# Patient Record
Sex: Female | Born: 1970 | Race: White | Hispanic: No | Smoking: Never smoker
Health system: Southern US, Community
[De-identification: ages and names within clinical notes are randomized; demographics above are authoritative.]

## PROBLEM LIST (undated history)

## (undated) DIAGNOSIS — D72829 Elevated white blood cell count, unspecified: Secondary | ICD-10-CM

## (undated) DIAGNOSIS — C9110 Chronic lymphocytic leukemia of B-cell type not having achieved remission: Secondary | ICD-10-CM

## (undated) DIAGNOSIS — E559 Vitamin D deficiency, unspecified: Secondary | ICD-10-CM

## (undated) DIAGNOSIS — C801 Malignant (primary) neoplasm, unspecified: Secondary | ICD-10-CM

## (undated) DIAGNOSIS — D649 Anemia, unspecified: Secondary | ICD-10-CM

## (undated) DIAGNOSIS — L930 Discoid lupus erythematosus: Secondary | ICD-10-CM

## (undated) HISTORY — DX: Elevated white blood cell count, unspecified: D72.829

## (undated) HISTORY — DX: Vitamin D deficiency, unspecified: E55.9

## (undated) HISTORY — PX: COLON RESECTION: SHX5231

## (undated) HISTORY — DX: Anemia, unspecified: D64.9

## (undated) HISTORY — DX: Chronic lymphocytic leukemia of B-cell type not having achieved remission: C91.10

## (undated) HISTORY — DX: Discoid lupus erythematosus: L93.0

## (undated) HISTORY — DX: Malignant (primary) neoplasm, unspecified: C80.1

---

## 2016-09-12 ENCOUNTER — Telehealth: Payer: Self-pay | Admitting: Rheumatology

## 2016-09-12 DIAGNOSIS — M2550 Pain in unspecified joint: Secondary | ICD-10-CM

## 2016-09-12 DIAGNOSIS — R300 Dysuria: Secondary | ICD-10-CM

## 2016-09-12 DIAGNOSIS — Z79899 Other long term (current) drug therapy: Secondary | ICD-10-CM

## 2016-09-12 NOTE — Telephone Encounter (Signed)
Patient had labs thru PCP 5/23, and will have those sent over here for review. Patient did had a CBC with DIFF. Patient needs to go to [facility] on [address] 6/6 for lab orders from here. Please send orders.

## 2016-09-13 NOTE — Telephone Encounter (Signed)
Labs released for patient. CBC from PCP was not received.

## 2016-09-14 ENCOUNTER — Other Ambulatory Visit: Payer: Self-pay | Admitting: Rheumatology

## 2016-09-15 LAB — URINALYSIS, ROUTINE W REFLEX MICROSCOPIC
Bilirubin Urine: NEGATIVE
GLUCOSE, UA: NEGATIVE
Hgb urine dipstick: NEGATIVE
Ketones, ur: NEGATIVE
LEUKOCYTES UA: NEGATIVE
Nitrite: NEGATIVE
Protein, ur: NEGATIVE
Specific Gravity, Urine: 1.008 (ref 1.001–1.035)
pH: 6.5 (ref 5.0–8.0)

## 2016-09-15 LAB — ANTI-DNA ANTIBODY, DOUBLE-STRANDED: DS DNA AB: 6 [IU]/mL — AB

## 2016-09-15 LAB — ANTI-NUCLEAR AB-TITER (ANA TITER)

## 2016-09-15 LAB — ANA: Anti Nuclear Antibody(ANA): POSITIVE — AB

## 2016-09-15 LAB — SEDIMENTATION RATE: SED RATE: 5 mm/h (ref 0–20)

## 2016-09-19 NOTE — Progress Notes (Signed)
Will need fu to discuss labs. Labs are stable

## 2016-09-21 ENCOUNTER — Encounter: Payer: Self-pay | Admitting: Rheumatology

## 2016-09-21 ENCOUNTER — Ambulatory Visit (INDEPENDENT_AMBULATORY_CARE_PROVIDER_SITE_OTHER): Payer: BLUE CROSS/BLUE SHIELD | Admitting: Rheumatology

## 2016-09-21 VITALS — BP 116/60 | HR 78 | Resp 14 | Ht 67.0 in | Wt 180.0 lb

## 2016-09-21 DIAGNOSIS — M19071 Primary osteoarthritis, right ankle and foot: Secondary | ICD-10-CM | POA: Diagnosis not present

## 2016-09-21 DIAGNOSIS — M19042 Primary osteoarthritis, left hand: Secondary | ICD-10-CM | POA: Diagnosis not present

## 2016-09-21 DIAGNOSIS — R5383 Other fatigue: Secondary | ICD-10-CM

## 2016-09-21 DIAGNOSIS — M7701 Medial epicondylitis, right elbow: Secondary | ICD-10-CM

## 2016-09-21 DIAGNOSIS — R768 Other specified abnormal immunological findings in serum: Secondary | ICD-10-CM | POA: Diagnosis not present

## 2016-09-21 DIAGNOSIS — M19072 Primary osteoarthritis, left ankle and foot: Secondary | ICD-10-CM | POA: Diagnosis not present

## 2016-09-21 DIAGNOSIS — M19041 Primary osteoarthritis, right hand: Secondary | ICD-10-CM

## 2016-09-21 NOTE — Progress Notes (Signed)
Office Visit Note  Patient: Holly Sanders             Date of Birth: 1970/08/17           MRN: [id number]             PCP: [name], MD Referring: No ref. provider found Visit Date: 09/21/2016 Occupation: @GUAROCC @    Subjective:  fatigue   History of Present Illness: Holly Sanders is a 46 y.o. female  with history of positive ANA and osteoarthritis. She returns today after one year. She states she's been asymptomatic. The only concern she has is fatigue. She gets tired very easily after activities. She's also noticed a lymph node in her left posterior cervical chain. She had recent haircut appointment. There is no history of recurrent oral ulcers, nasal ulcers, malar rash, photosensitivity, Raynauds.  Activities of Daily Living:  Patient reports morning stiffness for 2 minutes.   Patient Denies nocturnal pain.  Difficulty dressing/grooming: Denies Difficulty climbing stairs: Denies Difficulty getting out of chair: Denies Difficulty using hands for taps, buttons, cutlery, and/or writing: Denies   Review of Systems  Constitutional: Positive for fatigue. Negative for night sweats, weight gain, weight loss and weakness.  HENT: Negative for mouth sores, trouble swallowing, trouble swallowing, mouth dryness and nose dryness.   Eyes: Negative for pain, redness, visual disturbance and dryness.  Respiratory: Negative for cough, shortness of breath and difficulty breathing.   Cardiovascular: Negative for chest pain, palpitations, hypertension, irregular heartbeat and swelling in legs/feet.  Gastrointestinal: Negative for blood in stool, constipation and diarrhea.  Endocrine: Negative for increased urination.  Genitourinary: Negative for vaginal dryness.  Musculoskeletal: Negative for arthralgias, joint pain, joint swelling, myalgias, muscle weakness, morning stiffness, muscle tenderness and myalgias.  Skin: Negative for color change, rash, hair loss, skin tightness, ulcers and  sensitivity to sunlight.  Allergic/Immunologic: Negative for susceptible to infections.  Neurological: Negative for dizziness, memory loss and night sweats.  Hematological: Negative for swollen glands.  Psychiatric/Behavioral: Negative for depressed mood and sleep disturbance. The patient is not nervous/anxious.     PMFS History:  Patient Active Problem List   Diagnosis Date Noted  . Primary osteoarthritis of both hands 09/21/2016  . Primary osteoarthritis of both feet 09/21/2016  . ANA positive 09/21/2016  . Medial epicondylitis of elbow, right 09/21/2016    History reviewed. No pertinent past medical history.  No family history on file. Past Surgical History:  Procedure Laterality Date  . CESAREAN SECTION     Social History   Social History Narrative  . No narrative on file     Objective: Vital Signs: BP 116/60   Pulse 78   Resp 14   Ht 5\' 7"  (1.702 m)   Wt 180 lb (81.6 kg)   LMP 09/12/2016   BMI 28.19 kg/m    Physical Exam  Constitutional: She is oriented to person, place, and time. She appears well-developed and well-nourished.  HENT:  Head: Normocephalic and atraumatic.  Eyes: Conjunctivae and EOM are normal.  Neck: Normal range of motion.  Cardiovascular: Normal rate, regular rhythm, normal heart sounds and intact distal pulses.   Pulmonary/Chest: Effort normal and breath sounds normal.  Abdominal: Soft. Bowel sounds are normal.  Lymphadenopathy:    She has no cervical adenopathy.  Neurological: She is alert and oriented to person, place, and time.  Skin: Skin is warm and dry. Capillary refill takes less than 2 seconds.  Psychiatric: She has a normal mood and affect. Her behavior  is normal.  Nursing note and vitals reviewed.    Musculoskeletal Exam: C-spine and thoracic lumbar spine good range of motion. Shoulder joints elbow joints wrist joints with range of motion. She has mild DIP prominence in her hands and feet consistent with osteoarthritis. She has  mild tenderness on palpation over right medial epicondyle area consistent with medial epicondylitis. No synovitis was noted on examination today.  CDAI Exam: No CDAI exam completed.    Investigation: Findings:  April 2015:  CBC, comprehensive metabolic panel, hep panel, CK, UA, lupus anticoagulant, beta 2, anticardiolipin, C3, C4, ENA, vitamin D, B-12 were normal.  ANA was 1:1280 nuclear speckle pattern, double stranded DNA was 9.   Labs from March 02, 2014 shows C3, C4 is normal, ANA is negative, double stranded DNA is positive at 6.  It was 9 on her last one done in April 2015.  June 2017:  Sed rate, UA, CBC, comprehensive metabolic panel, TSH, HIV were all normal.  ANA is 1:40 nuclear speckled pattern and double-strand DNA was 6.  09/14/16 ANA positive 1:160 titer dsDNA 6 08/29/2016 CBC to be busy count 12.0 hemoglobin 11.9 platelets 229, CMP normal, TSH normal, HIV-negative, lipid panel LDL 121    Imaging: No results found.  Speciality Comments: No specialty comments available.    Procedures:  No procedures performed Allergies: Patient has no allergy information on record.   Assessment / Plan:     Visit Diagnoses: Primary osteoarthritis of both hands: Joint protection and muscle strengthening was discussed.  Primary osteoarthritis of both feet: Proper fitting shoes were discussed.  ANA positive - her most recent labs show low titer positive ANA and double-stranded DNA. She has no clinical features of autoimmune disease on examination today. She reports a palpable cervical lymph node which I could not palpate on examination. She has occasional oral ulcers. No synovitis was noted. She will notify me if she develops any new symptoms.  Other fatigue -her CBC recently showed anemia. Dietary sources for an were discussed. Her TSH was normal. I'll check vitamin D today. Plan: VITAMIN D 25 Hydroxy (Vit-D Deficiency, Fractures)  Medial epicondylitis of elbow, right : She does have  Voltaren gel at home which she can use topically.   Orders: Orders Placed This Encounter  Procedures  . VITAMIN D 25 Hydroxy (Vit-D Deficiency, Fractures)   No orders of the defined types were placed in this encounter.   Face-to-face time spent with patient was 20 minutes. 50% of time was spent in counseling and coordination of care.  Follow-Up Instructions: Return in about 1 year (around 09/21/2017) for +ANA, fatigue, OA, plan AVISE.   Bo Merino, MD  Note - This record has been created using Editor, commissioning.  Chart creation errors have been sought, but may not always  have been located. Such creation errors do not reflect on  the standard of medical care.

## 2016-09-22 LAB — VITAMIN D 25 HYDROXY (VIT D DEFICIENCY, FRACTURES): VIT D 25 HYDROXY: 31 ng/mL (ref 30–100)

## 2016-09-22 NOTE — Progress Notes (Signed)
Vit D 2000 U po qd

## 2016-10-03 ENCOUNTER — Telehealth: Payer: Self-pay | Admitting: Radiology

## 2016-10-03 NOTE — Telephone Encounter (Signed)
Called patient to advise  °

## 2016-10-03 NOTE — Telephone Encounter (Signed)
-----   Message from Bo Merino, MD sent at 09/22/2016  2:28 PM EDT ----- Vit D 2000 U po qd

## 2016-12-05 ENCOUNTER — Telehealth: Payer: Self-pay | Admitting: *Deleted

## 2016-12-05 NOTE — Telephone Encounter (Signed)
Results sent from [facility] drawn on on 08/29/16 WBC 12.0 Rest of labs WNL HIV-Negative

## 2017-09-07 ENCOUNTER — Telehealth: Payer: Self-pay | Admitting: Rheumatology

## 2017-09-07 NOTE — Telephone Encounter (Signed)
Patient called wanting to know if she is suppose to get labs done, and if so, where?  BD#[phone]

## 2017-09-10 NOTE — Telephone Encounter (Signed)
Patient advised that Dr. Estanislado Pandy wants her to have [place] labs and they can be done at any lab test now. Patient will come by office to pick up forms to have take to the lab to have them drawn and will reschedule appointment in 2-3 weeks.

## 2017-09-21 ENCOUNTER — Ambulatory Visit: Payer: BLUE CROSS/BLUE SHIELD | Admitting: Rheumatology

## 2018-06-13 ENCOUNTER — Other Ambulatory Visit (INDEPENDENT_AMBULATORY_CARE_PROVIDER_SITE_OTHER): Payer: Self-pay | Admitting: Orthopaedic Surgery

## 2018-06-13 MED ORDER — DOXYCYCLINE HYCLATE 100 MG PO TABS: 100.0000 mg | ORAL_TABLET | Freq: Two times a day (BID) | ORAL | 0 refills | Status: DC

## 2021-04-26 ENCOUNTER — Other Ambulatory Visit: Payer: Self-pay | Admitting: Family Medicine

## 2021-04-28 ENCOUNTER — Telehealth: Payer: Self-pay | Admitting: Internal Medicine

## 2021-04-28 NOTE — Telephone Encounter (Signed)
Scheduled appt per 1/18 referral. Spoke to pt who is aware of appt date and time. Pt is aware to arrive 15 mins prior to appt time.

## 2021-05-03 ENCOUNTER — Other Ambulatory Visit: Payer: Self-pay | Admitting: Family Medicine

## 2021-05-03 DIAGNOSIS — N6320 Unspecified lump in the left breast, unspecified quadrant: Secondary | ICD-10-CM

## 2021-05-06 ENCOUNTER — Ambulatory Visit
Admission: RE | Admit: 2021-05-06 | Discharge: 2021-05-06 | Disposition: A | Discharge status: Home / Self Care | Payer: BLUE CROSS/BLUE SHIELD | Source: Ambulatory Visit | Attending: Family Medicine | Admitting: Family Medicine

## 2021-05-06 ENCOUNTER — Ambulatory Visit
Admission: RE | Admit: 2021-05-06 | Discharge: 2021-05-06 | Disposition: A | Discharge status: Home / Self Care | Payer: PRIVATE HEALTH INSURANCE | Source: Ambulatory Visit | Attending: Family Medicine | Admitting: Family Medicine

## 2021-05-06 DIAGNOSIS — N6320 Unspecified lump in the left breast, unspecified quadrant: Secondary | ICD-10-CM

## 2021-05-07 ENCOUNTER — Other Ambulatory Visit: Payer: Self-pay | Admitting: Family Medicine

## 2021-05-07 DIAGNOSIS — N6320 Unspecified lump in the left breast, unspecified quadrant: Secondary | ICD-10-CM

## 2021-05-16 ENCOUNTER — Inpatient Hospital Stay: Payer: No Typology Code available for payment source | Attending: Internal Medicine | Admitting: Internal Medicine

## 2021-05-16 ENCOUNTER — Other Ambulatory Visit: Payer: Self-pay | Admitting: Internal Medicine

## 2021-05-16 ENCOUNTER — Inpatient Hospital Stay: Payer: No Typology Code available for payment source

## 2021-05-16 ENCOUNTER — Encounter: Payer: Self-pay | Admitting: Internal Medicine

## 2021-05-16 ENCOUNTER — Telehealth: Payer: Self-pay | Admitting: Pharmacy Technician

## 2021-05-16 ENCOUNTER — Other Ambulatory Visit: Payer: Self-pay

## 2021-05-16 VITALS — BP 135/85 | HR 96 | Temp 98.3°F | Resp 19 | Ht 67.0 in | Wt 200.9 lb

## 2021-05-16 DIAGNOSIS — D539 Nutritional anemia, unspecified: Secondary | ICD-10-CM

## 2021-05-16 DIAGNOSIS — D500 Iron deficiency anemia secondary to blood loss (chronic): Secondary | ICD-10-CM

## 2021-05-16 DIAGNOSIS — D72820 Lymphocytosis (symptomatic): Secondary | ICD-10-CM | POA: Insufficient documentation

## 2021-05-16 DIAGNOSIS — Z79899 Other long term (current) drug therapy: Secondary | ICD-10-CM | POA: Insufficient documentation

## 2021-05-16 DIAGNOSIS — Z808 Family history of malignant neoplasm of other organs or systems: Secondary | ICD-10-CM | POA: Diagnosis not present

## 2021-05-16 DIAGNOSIS — Z807 Family history of other malignant neoplasms of lymphoid, hematopoietic and related tissues: Secondary | ICD-10-CM | POA: Diagnosis not present

## 2021-05-16 DIAGNOSIS — D509 Iron deficiency anemia, unspecified: Secondary | ICD-10-CM | POA: Diagnosis not present

## 2021-05-16 LAB — FOLATE: Folate: 19.8 ng/mL (ref >5.9)

## 2021-05-16 LAB — RETICULOCYTES
Immature Retic Fract: 14.2 % (ref 2.3–15.9)
RBC.: 3.85 MIL/uL — ABNORMAL LOW (ref 3.87–5.11)
Retic Count, Absolute: 73.9 10*3/uL (ref 19.0–186.0)
Retic Ct Pct: 1.9 % (ref 0.4–3.1)

## 2021-05-16 LAB — CBC WITH DIFFERENTIAL (CANCER CENTER ONLY)
Abs Immature Granulocytes: 0.02 10*3/uL (ref 0.00–0.07)
Basophils Absolute: 0 10*3/uL (ref 0.0–0.1)
Basophils Relative: 0 %
Eosinophils Absolute: 0.2 10*3/uL (ref 0.0–0.5)
Eosinophils Relative: 1 %
HCT: 33.4 % — ABNORMAL LOW (ref 36.0–46.0)
Hemoglobin: 10.7 g/dL — ABNORMAL LOW (ref 12.0–15.0)
Immature Granulocytes: 0 %
Lymphocytes Relative: 62 %
Lymphs Abs: 9.1 10*3/uL — ABNORMAL HIGH (ref 0.7–4.0)
MCH: 28 pg (ref 26.0–34.0)
MCHC: 32 g/dL (ref 30.0–36.0)
MCV: 87.4 fL (ref 80.0–100.0)
Monocytes Absolute: 0.6 10*3/uL (ref 0.1–1.0)
Monocytes Relative: 4 %
Neutro Abs: 4.9 10*3/uL (ref 1.7–7.7)
Neutrophils Relative %: 33 %
Platelet Count: 330 10*3/uL (ref 150–400)
RBC: 3.82 MIL/uL — ABNORMAL LOW (ref 3.87–5.11)
RDW: 15.7 % — ABNORMAL HIGH (ref 11.5–15.5)
Smear Review: NORMAL
WBC Count: 14.8 10*3/uL — ABNORMAL HIGH (ref 4.0–10.5)
nRBC: 0 % (ref 0.0–0.2)

## 2021-05-16 LAB — CMP (CANCER CENTER ONLY)
ALT: 13 U/L (ref 0–44)
AST: 15 U/L (ref 15–41)
Albumin: 4.3 g/dL (ref 3.5–5.0)
Alkaline Phosphatase: 71 U/L (ref 38–126)
Anion gap: 6 (ref 5–15)
BUN: 14 mg/dL (ref 6–20)
CO2: 29 mmol/L (ref 22–32)
Calcium: 9.4 mg/dL (ref 8.9–10.3)
Chloride: 104 mmol/L (ref 98–111)
Creatinine: 0.85 mg/dL (ref 0.44–1.00)
GFR, Estimated: >60 mL/min (ref >60)
Glucose, Bld: 96 mg/dL (ref 70–99)
Potassium: 4.1 mmol/L (ref 3.5–5.1)
Sodium: 139 mmol/L (ref 135–145)
Total Bilirubin: 0.3 mg/dL (ref 0.3–1.2)
Total Protein: 7.6 g/dL (ref 6.5–8.1)

## 2021-05-16 LAB — IRON AND IRON BINDING CAPACITY (CC-WL,HP ONLY)
Iron: 35 ug/dL (ref 28–170)
Saturation Ratios: 8 % — ABNORMAL LOW (ref 10.4–31.8)
TIBC: 447 ug/dL (ref 250–450)
UIBC: 412 ug/dL (ref 148–442)

## 2021-05-16 LAB — LACTATE DEHYDROGENASE: LDH: 150 U/L (ref 98–192)

## 2021-05-16 LAB — VITAMIN B12: Vitamin B-12: 305 pg/mL (ref 180–914)

## 2021-05-16 LAB — TSH: TSH: 3.537 u[IU]/mL (ref 0.308–3.960)

## 2021-05-16 LAB — FERRITIN: Ferritin: 17 ng/mL (ref 11–307)

## 2021-05-16 NOTE — Patient Instructions (Signed)
Thank you for choosing [facility] to provide your care.   Should you have questions after your visit to the [facility] ([facility]), please contact this office at [phone] between 8:30 AM and 4:30 PM.  Voice mails left after 4:00 PM may not be returned until the following business day.  Calls received after 4:30 PM will be answered by an off-site Nurse Triage Line.    Prescription Refills:  Please have your pharmacy contact us directly for most prescription requests.  Contact the office directly for refills of narcotics (pain medications). Allow 48-72 hours for refills.  Appointments: Please contact the [facility] scheduling department [phone] for questions regarding [facility] appointment scheduling.  Contact the schedulers with any scheduling changes so that your appointment can be rescheduled in a timely manner.   Central Scheduling for [facility] [phone] - Call to schedule procedures such as PET scans, CT scans, MRI, Ultrasound, etc.  To afford each patient quality time with our providers, please arrive 30 minutes before your scheduled appointment time.  If you arrive late for your appointment, you may be asked to reschedule.  We strive to give you quality time with our providers, and arriving late affects you and other patients whose appointments are after yours. If you are a no show for multiple scheduled visits, you may be dismissed from the clinic at the providers discretion.     Resources: [facility] Social Workers [phone] for additional information on assistance programs or assistance connecting with community support programs   [place] DSS  336-641-3447: Information regarding food stamps, Medicaid, and utility assistance [place] Access [place] 336-333-6589   [place] [employer]'s shared-ride transportation service for eligible riders who have a disability that prevents them from riding the fixed route bus.   Medicare Rights Center 800-333-4114  Helps people with Medicare understand their rights and benefits, navigate the Medicare system, and secure the quality healthcare they deserve [employer] 800-227-2345 Assists patients locate various types of support and financial assistance Cancer Care: 1-800-813-HOPE (4673) Provides financial assistance, online support groups, medication/co-pay assistance.   Transportation Assistance for appointments at [facility]: Transportation Coordinator [phone]  Again, thank you for choosing [facility] for your care.       

## 2021-05-16 NOTE — Progress Notes (Signed)
[facility] Telephone:([phone]   [phone]  CONSULT NOTE  REFERRING PHYSICIAN: Dr. Rachell Cipro  REASON FOR CONSULTATION:  51 years old white female with leukocytosis and anemia  HPI Holly Sanders is a 51 y.o. female with past medical history significant for anemia for several years in addition to history of lupus, vitamin D deficiency, dyslipidemia.  The patient was followed by her primary care physician and routine blood work showed persistent elevated white blood count in addition to anemia.  She had iron studies performed at that time that showed low serum ferritin of 9.  The patient started ferrous sulfate around 6 months ago with no improvement in her anemia.  She is to have anemia for long time with heavy menstrual period and clotting.  She did not take any iron supplements during that time.  She is feeling fine with no concerning complaints except for mild fatigue.  She has no dizzy spells.  She has no bleeding, bruises or ecchymosis.  She has no chest pain, shortness of breath, cough or hemoptysis.  She has no nausea, vomiting, diarrhea or constipation. Family history significant for father with multiple malignancy including lymphoma, sarcoma and squamous cell carcinoma of the head and neck.  Mother had heart disease.  Brother had heart disease. She is divorced and has 3 children (triplet age 74).  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  The patient has no history of smoking, alcohol or drug abuse.  HPI  Past Medical History:  Diagnosis Date   Anemia    Leukocytosis    Lupus erythematosus    Vitamin D deficiency     Past Surgical History:  Procedure Laterality Date   CESAREAN SECTION      Family History  Problem Relation Age of Onset   Heart disease Mother    Lymphoma Father    Squamous cell carcinoma Father    Basal cell carcinoma Father    Heart disease Brother     Social History Social  History   Tobacco Use   Smoking status: Never   Smokeless tobacco: Never  Vaping Use   Vaping Use: Never used  Substance Use Topics   Alcohol use: No   Drug use: No    Not on File  Current Outpatient Medications  Medication Sig Dispense Refill   diclofenac sodium (VOLTAREN) 1 % GEL Apply 4 g topically 4 (four) times daily. Prn     doxycycline (VIBRA-TABS) 100 MG tablet Take 1 tablet (100 mg total) by mouth 2 (two) times daily. 30 tablet 0   No current facility-administered medications for this visit.    Review of Systems  Constitutional: positive for fatigue Eyes: negative Ears, nose, mouth, throat, and face: negative Respiratory: negative Cardiovascular: negative Gastrointestinal: negative Genitourinary:negative Integument/breast: negative Hematologic/lymphatic: negative Musculoskeletal:negative Neurological: negative Behavioral/Psych: negative Endocrine: negative Allergic/Immunologic: negative  Physical Exam  CWU:GQBVQ, healthy, no distress, well nourished, and well developed SKIN: skin color, texture, turgor are normal, no rashes or significant lesions HEAD: Normocephalic, No masses, lesions, tenderness or abnormalities EYES: normal, PERRLA, Conjunctiva are pink and non-injected EARS: External ears normal, Canals clear OROPHARYNX:no exudate, no erythema, and lips, buccal mucosa, and tongue normal  NECK: supple, no adenopathy, no JVD LYMPH:  no palpable lymphadenopathy, no hepatosplenomegaly BREAST:not examined LUNGS: clear to auscultation , and palpation HEART: regular rate & rhythm, no murmurs, and no gallops ABDOMEN:abdomen soft, non-tender, normal bowel sounds, and no masses or organomegaly BACK: Back  symmetric, no curvature., No CVA tenderness EXTREMITIES:no joint deformities, effusion, or inflammation, no edema  NEURO: alert & oriented x 3 with fluent speech, no focal motor/sensory deficits  PERFORMANCE STATUS: ECOG 1  LABORATORY DATA: Lab Results   Component Value Date   WBC 14.8 (H) 05/16/2021   HGB 10.7 (L) 05/16/2021   HCT 33.4 (L) 05/16/2021   MCV 87.4 05/16/2021   PLT 330 05/16/2021      Chemistry      Component Value Date/Time   NA 139 05/16/2021 1123   K 4.1 05/16/2021 1123   CL 104 05/16/2021 1123   CO2 29 05/16/2021 1123   BUN 14 05/16/2021 1123   CREATININE 0.85 05/16/2021 1123      Component Value Date/Time   CALCIUM 9.4 05/16/2021 1123   ALKPHOS 71 05/16/2021 1123   AST 15 05/16/2021 1123   ALT 13 05/16/2021 1123   BILITOT 0.3 05/16/2021 1123       RADIOGRAPHIC STUDIES: US BREAST LTD UNI LEFT INC AXILLA  Result Date: 05/06/2021 CLINICAL DATA:  Palpable abnormality in the LEFT breast noted about a month ago. Mass has resolved. EXAM: DIGITAL DIAGNOSTIC BILATERAL MAMMOGRAM WITH TOMOSYNTHESIS AND CAD; ULTRASOUND LEFT BREAST LIMITED TECHNIQUE: Bilateral digital diagnostic mammography and breast tomosynthesis was performed. The images were evaluated with computer-aided detection.; Targeted ultrasound examination of the left breast was performed. COMPARISON:  None. ACR Breast Density Category b: There are scattered areas of fibroglandular density. FINDINGS: No suspicious mass, distortion, or microcalcifications are identified to suggest presence of malignancy. On physical exam, I palpate no discrete mass in the 12 o'clock location of the LEFT breast 8 centimeters from the nipple, in the area where patient originally palpated a mass. Patient is no longer able to feel the mass. There are no visible changes of the skin in this region. Targeted ultrasound is performed, showing normal appearing fibrofatty tissue in the area of patient's concern. Incidental note is made a mildly dilated duct in the 12 o'clock location of the LEFT breast 1 centimeter from the nipple, not associated with intraductal mass or suspicious features. IMPRESSION: No mammographic or ultrasound evidence for malignancy. RECOMMENDATION: Screening mammogram  in one year.(Code:SM-B-01Y) I have discussed the findings and recommendations with the patient. If applicable, a reminder letter will be sent to the patient regarding the next appointment. BI-RADS CATEGORY  2: Benign. Electronically Signed   By: Nolon Nations M.D.   On: 05/06/2021 12:16  MM DIAG BREAST TOMO BILATERAL  Result Date: 05/06/2021 CLINICAL DATA:  Palpable abnormality in the LEFT breast noted about a month ago. Mass has resolved. EXAM: DIGITAL DIAGNOSTIC BILATERAL MAMMOGRAM WITH TOMOSYNTHESIS AND CAD; ULTRASOUND LEFT BREAST LIMITED TECHNIQUE: Bilateral digital diagnostic mammography and breast tomosynthesis was performed. The images were evaluated with computer-aided detection.; Targeted ultrasound examination of the left breast was performed. COMPARISON:  None. ACR Breast Density Category b: There are scattered areas of fibroglandular density. FINDINGS: No suspicious mass, distortion, or microcalcifications are identified to suggest presence of malignancy. On physical exam, I palpate no discrete mass in the 12 o'clock location of the LEFT breast 8 centimeters from the nipple, in the area where patient originally palpated a mass. Patient is no longer able to feel the mass. There are no visible changes of the skin in this region. Targeted ultrasound is performed, showing normal appearing fibrofatty tissue in the area of patient's concern. Incidental note is made a mildly dilated duct in the 12 o'clock location of the LEFT breast 1 centimeter from the  nipple, not associated with intraductal mass or suspicious features. IMPRESSION: No mammographic or ultrasound evidence for malignancy. RECOMMENDATION: Screening mammogram in one year.(Code:SM-B-01Y) I have discussed the findings and recommendations with the patient. If applicable, a reminder letter will be sent to the patient regarding the next appointment. BI-RADS CATEGORY  2: Benign. Electronically Signed   By: Nolon Nations M.D.   On: 05/06/2021  12:16    ASSESSMENT: This is a very pleasant 51 years old white female presented for evaluation of leukocytosis and anemia. Her leukocytosis is mainly in the form of lymphocytosis and there is concern about possibility of chronic lymphocytic leukemia. Her anemia secondary to iron deficiency.   PLAN: I had a lengthy discussion with the patient today about her current condition and further investigation to identify the underlying etiology of her condition. I ordered several studies today for evaluation of her anemia and lymphocytosis. I ordered CBC, comprehensive metabolic panel, LDH, iron study, ferritin, serum folate, vitamin B12, serum protein electrophoresis with immunofixation, flow cytometry of the peripheral blood for the CLL panel. I will arrange for the patient to receive iron infusion with Venofer 300 mg IV weekly for 3 weeks at the [facility] center because of her persistent anemia and iron deficiency with no improvement in the oral iron tablets. I will arrange for the patient to come back for follow-up visit in 3 weeks for evaluation and discussion of her blood work and further recommendation regarding her lymphocytosis. The patient was advised to call immediately if she has any other concerning symptoms in the interval.  The patient voices understanding of current disease status and treatment options and is in agreement with the current care plan.  All questions were answered. The patient knows to call the clinic with any problems, questions or concerns. We can certainly see the patient much sooner if necessary.  Thank you so much for allowing me to participate in the care of Tiger. I will continue to follow up the patient with you and assist in her care.  The total time spent in the appointment was 60 minutes.  Disclaimer: This note was dictated with voice recognition software. Similar sounding words can inadvertently be transcribed and may not be corrected upon  review.   Eilleen Kempf May 16, 2021, 11:48 AM

## 2021-05-16 NOTE — Telephone Encounter (Signed)
VENOFERFranz Sanders  ID# [id number] GR# [id number] PHONE: [phone]  Spoke with patient in regards to insurance being in-active. patient is considering paying cash for treatment [employer] issue is resolved. Awaiting f/u call/determination from patient.

## 2021-05-17 LAB — PROTEIN ELECTROPHORESIS, SERUM, WITH REFLEX
A/G Ratio: 1.1 (ref 0.7–1.7)
Albumin ELP: 3.6 g/dL (ref 2.9–4.4)
Alpha-1-Globulin: 0.2 g/dL (ref 0.0–0.4)
Alpha-2-Globulin: 0.8 g/dL (ref 0.4–1.0)
Beta Globulin: 1.2 g/dL (ref 0.7–1.3)
Gamma Globulin: 1 g/dL (ref 0.4–1.8)
Globulin, Total: 3.2 g/dL (ref 2.2–3.9)
Total Protein ELP: 6.8 g/dL (ref 6.0–8.5)

## 2021-05-17 LAB — SURGICAL PATHOLOGY

## 2021-05-19 LAB — FLOW CYTOMETRY

## 2021-05-23 ENCOUNTER — Encounter: Payer: Self-pay | Admitting: Internal Medicine

## 2021-05-23 NOTE — Telephone Encounter (Signed)
Auth Submission: NO AUTH NEEDED Payer: [employer] Medication & CPT/J Code(s) submitted: Venofer (Iron Sucrose) J1756 Route of submission (phone, fax, portal): PHONE Auth type: Buy/Bill Units/visits requested: 3 Reference number: [id number]-E 05/23/21 1:26p Approval from: 05/23/21 to 09/20/21  Patient will be scheduled as soon as possible

## 2021-05-23 NOTE — Telephone Encounter (Signed)
Patient call with updated insurance info: [facility] ID# [id number] GR# [place] PCN# [id number] [phone]

## 2021-05-24 ENCOUNTER — Telehealth: Payer: Self-pay | Admitting: Internal Medicine

## 2021-05-24 NOTE — Telephone Encounter (Signed)
Sch per 2/14 inbasket, pt aware °

## 2021-05-26 ENCOUNTER — Ambulatory Visit (INDEPENDENT_AMBULATORY_CARE_PROVIDER_SITE_OTHER): Payer: No Typology Code available for payment source

## 2021-05-26 ENCOUNTER — Encounter: Payer: Self-pay | Admitting: Internal Medicine

## 2021-05-26 ENCOUNTER — Other Ambulatory Visit: Payer: Self-pay

## 2021-05-26 VITALS — BP 123/70 | HR 76 | Temp 98.4°F | Resp 16 | Wt 200.4 lb

## 2021-05-26 DIAGNOSIS — D500 Iron deficiency anemia secondary to blood loss (chronic): Secondary | ICD-10-CM

## 2021-05-26 MED ORDER — FAMOTIDINE IN NACL 20-0.9 MG/50ML-% IV SOLN: 20.0000 mg | Freq: Once | INTRAVENOUS | Status: DC | PRN

## 2021-05-26 MED ORDER — METHYLPREDNISOLONE SODIUM SUCC 125 MG IJ SOLR: 125.0000 mg | Freq: Once | INTRAMUSCULAR | Status: DC | PRN

## 2021-05-26 MED ORDER — ALBUTEROL SULFATE HFA 108 (90 BASE) MCG/ACT IN AERS: 2.0000 | INHALATION_SPRAY | Freq: Once | RESPIRATORY_TRACT | Status: DC | PRN

## 2021-05-26 MED ORDER — SODIUM CHLORIDE 0.9 % IV SOLN
300.0000 mg | INTRAVENOUS | Status: DC
  Administered 2021-05-26: 300 mg via INTRAVENOUS
  Filled 2021-05-26: qty 15

## 2021-05-26 MED ORDER — DIPHENHYDRAMINE HCL 50 MG/ML IJ SOLN: 50.0000 mg | Freq: Once | INTRAMUSCULAR | Status: DC | PRN

## 2021-05-26 MED ORDER — EPINEPHRINE 0.3 MG/0.3ML IJ SOAJ: 0.3000 mg | Freq: Once | INTRAMUSCULAR | Status: DC | PRN

## 2021-05-26 MED ORDER — SODIUM CHLORIDE 0.9 % IV SOLN: Freq: Once | INTRAVENOUS | Status: DC | PRN

## 2021-05-26 NOTE — Progress Notes (Signed)
Diagnosis: Iron Deficiency Anemia  Provider:  Marshell Garfinkel, MD  Procedure: Infusion  IV Type: Peripheral, IV Location: L Forearm  Venofer (Iron Sucrose), Dose: 300 mg  Infusion Start Time: 1368  Infusion Stop Time: 5992  Post Infusion IV Care: Observation period completed  Discharge: Condition: Good, Destination: Home . AVS provided to patient.   Performed by:  Paul Dykes, RN

## 2021-06-02 ENCOUNTER — Other Ambulatory Visit: Payer: Self-pay

## 2021-06-02 ENCOUNTER — Ambulatory Visit (INDEPENDENT_AMBULATORY_CARE_PROVIDER_SITE_OTHER): Payer: No Typology Code available for payment source

## 2021-06-02 VITALS — BP 116/74 | HR 68 | Temp 98.3°F | Resp 18 | Ht 67.0 in | Wt 201.8 lb

## 2021-06-02 DIAGNOSIS — D500 Iron deficiency anemia secondary to blood loss (chronic): Secondary | ICD-10-CM | POA: Diagnosis not present

## 2021-06-02 MED ORDER — SODIUM CHLORIDE 0.9 % IV SOLN: Freq: Once | INTRAVENOUS | Status: DC | PRN

## 2021-06-02 MED ORDER — FAMOTIDINE IN NACL 20-0.9 MG/50ML-% IV SOLN: 20.0000 mg | Freq: Once | INTRAVENOUS | Status: DC | PRN

## 2021-06-02 MED ORDER — SODIUM CHLORIDE 0.9 % IV SOLN
300.0000 mg | INTRAVENOUS | Status: DC
  Administered 2021-06-02: 300 mg via INTRAVENOUS
  Filled 2021-06-02: qty 15

## 2021-06-02 MED ORDER — EPINEPHRINE 0.3 MG/0.3ML IJ SOAJ: 0.3000 mg | Freq: Once | INTRAMUSCULAR | Status: DC | PRN

## 2021-06-02 MED ORDER — DIPHENHYDRAMINE HCL 50 MG/ML IJ SOLN: 50.0000 mg | Freq: Once | INTRAMUSCULAR | Status: DC | PRN

## 2021-06-02 MED ORDER — ALBUTEROL SULFATE HFA 108 (90 BASE) MCG/ACT IN AERS: 2.0000 | INHALATION_SPRAY | Freq: Once | RESPIRATORY_TRACT | Status: DC | PRN

## 2021-06-02 MED ORDER — METHYLPREDNISOLONE SODIUM SUCC 125 MG IJ SOLR: 125.0000 mg | Freq: Once | INTRAMUSCULAR | Status: DC | PRN

## 2021-06-02 NOTE — Progress Notes (Signed)
Diagnosis: Iron Deficiency Anemia  Provider:  Marshell Garfinkel, MD  Procedure: Infusion  IV Type: Peripheral, IV Location: R Forearm  Venofer (Iron Sucrose), Dose: 300 mg  Infusion Start Time: 13.46 06/02/2021  Infusion Stop Time: 15.29 06/02/2021  Post Infusion IV Care: Peripheral IV Discontinued  Discharge: Condition: Good, Destination: Home . AVS provided to patient.   Performed by:  Arnoldo Morale, RN

## 2021-06-06 ENCOUNTER — Inpatient Hospital Stay: Payer: No Typology Code available for payment source

## 2021-06-06 ENCOUNTER — Inpatient Hospital Stay: Payer: No Typology Code available for payment source | Admitting: Internal Medicine

## 2021-06-09 ENCOUNTER — Other Ambulatory Visit: Payer: Self-pay

## 2021-06-09 ENCOUNTER — Ambulatory Visit (INDEPENDENT_AMBULATORY_CARE_PROVIDER_SITE_OTHER): Payer: No Typology Code available for payment source

## 2021-06-09 VITALS — BP 123/78 | HR 75 | Temp 98.0°F | Resp 16 | Ht 67.0 in | Wt 201.8 lb

## 2021-06-09 DIAGNOSIS — D500 Iron deficiency anemia secondary to blood loss (chronic): Secondary | ICD-10-CM

## 2021-06-09 MED ORDER — SODIUM CHLORIDE 0.9 % IV SOLN: Freq: Once | INTRAVENOUS | Status: DC | PRN

## 2021-06-09 MED ORDER — FAMOTIDINE IN NACL 20-0.9 MG/50ML-% IV SOLN: 20.0000 mg | Freq: Once | INTRAVENOUS | Status: DC | PRN

## 2021-06-09 MED ORDER — METHYLPREDNISOLONE SODIUM SUCC 125 MG IJ SOLR: 125.0000 mg | Freq: Once | INTRAMUSCULAR | Status: DC | PRN

## 2021-06-09 MED ORDER — SODIUM CHLORIDE 0.9 % IV SOLN
300.0000 mg | INTRAVENOUS | Status: DC
  Administered 2021-06-09: 300 mg via INTRAVENOUS
  Filled 2021-06-09: qty 15

## 2021-06-09 MED ORDER — DIPHENHYDRAMINE HCL 50 MG/ML IJ SOLN: 50.0000 mg | Freq: Once | INTRAMUSCULAR | Status: DC | PRN

## 2021-06-09 MED ORDER — EPINEPHRINE 0.3 MG/0.3ML IJ SOAJ: 0.3000 mg | Freq: Once | INTRAMUSCULAR | Status: DC | PRN

## 2021-06-09 MED ORDER — ALBUTEROL SULFATE HFA 108 (90 BASE) MCG/ACT IN AERS: 2.0000 | INHALATION_SPRAY | Freq: Once | RESPIRATORY_TRACT | Status: DC | PRN

## 2021-06-09 NOTE — Progress Notes (Signed)
Diagnosis: Iron Deficiency Anemia ? ?Provider:  Marshell Garfinkel, MD ? ?Procedure: Infusion ? ?IV Type: Peripheral, IV Location: L Forearm ? ?Venofer (Iron Sucrose), Dose: 300 mg ? ?Infusion Start Time: 2094 ? ?Infusion Stop Time: 7096 ? ?Post Infusion IV Care: Peripheral IV Discontinued ? ?Discharge: Condition: Good, Destination: Home . AVS provided to patient.  ? ?Performed by:  Paul Dykes, RN  ?  ?

## 2021-06-13 ENCOUNTER — Other Ambulatory Visit: Payer: Self-pay

## 2021-06-13 ENCOUNTER — Inpatient Hospital Stay (HOSPITAL_BASED_OUTPATIENT_CLINIC_OR_DEPARTMENT_OTHER): Payer: No Typology Code available for payment source | Admitting: Internal Medicine

## 2021-06-13 ENCOUNTER — Inpatient Hospital Stay: Payer: No Typology Code available for payment source | Attending: Internal Medicine

## 2021-06-13 VITALS — BP 132/78 | HR 90 | Temp 97.9°F | Resp 19 | Ht 67.0 in | Wt 203.5 lb

## 2021-06-13 DIAGNOSIS — D500 Iron deficiency anemia secondary to blood loss (chronic): Secondary | ICD-10-CM | POA: Diagnosis not present

## 2021-06-13 DIAGNOSIS — C9110 Chronic lymphocytic leukemia of B-cell type not having achieved remission: Secondary | ICD-10-CM | POA: Insufficient documentation

## 2021-06-13 DIAGNOSIS — D72820 Lymphocytosis (symptomatic): Secondary | ICD-10-CM

## 2021-06-13 DIAGNOSIS — D509 Iron deficiency anemia, unspecified: Secondary | ICD-10-CM | POA: Insufficient documentation

## 2021-06-13 LAB — CBC WITH DIFFERENTIAL (CANCER CENTER ONLY)
Abs Immature Granulocytes: 0.04 10*3/uL (ref 0.00–0.07)
Basophils Absolute: 0.1 10*3/uL (ref 0.0–0.1)
Basophils Relative: 0 %
Eosinophils Absolute: 0.1 10*3/uL (ref 0.0–0.5)
Eosinophils Relative: 1 %
HCT: 31.8 % — ABNORMAL LOW (ref 36.0–46.0)
Hemoglobin: 10.5 g/dL — ABNORMAL LOW (ref 12.0–15.0)
Immature Granulocytes: 0 %
Lymphocytes Relative: 58 %
Lymphs Abs: 8.7 10*3/uL — ABNORMAL HIGH (ref 0.7–4.0)
MCH: 29 pg (ref 26.0–34.0)
MCHC: 33 g/dL (ref 30.0–36.0)
MCV: 87.8 fL (ref 80.0–100.0)
Monocytes Absolute: 0.7 10*3/uL (ref 0.1–1.0)
Monocytes Relative: 5 %
Neutro Abs: 5.4 10*3/uL (ref 1.7–7.7)
Neutrophils Relative %: 36 %
Platelet Count: 276 10*3/uL (ref 150–400)
RBC: 3.62 MIL/uL — ABNORMAL LOW (ref 3.87–5.11)
RDW: 16.2 % — ABNORMAL HIGH (ref 11.5–15.5)
Smear Review: NORMAL
WBC Count: 14.9 10*3/uL — ABNORMAL HIGH (ref 4.0–10.5)
nRBC: 0 % (ref 0.0–0.2)

## 2021-06-13 NOTE — Progress Notes (Signed)
?    [facility] ?[phone]   [phone] ? ?OFFICE PROGRESS NOTE ? ?[name], MD ?[address] ?[place] [address] ? ?DIAGNOSIS:  ?1) Lymphocytosis consistent with CLL. ?2) iron deficient anemia ? ?PRIOR THERAPY: Iron infusion with Venofer 300 mg IV weekly for 3 weeks. ? ?CURRENT THERAPY: None ? ?INTERVAL HISTORY: ?Holly Sanders 51 y.o. female returns to the clinic today for follow-up visit.  The patient is feeling fine today with no concerning complaints.  She denied having any fatigue or weakness.  She has no nausea, vomiting, diarrhea or constipation.  She has no headache or visual changes.  She denied having any recent weight loss or night sweats.  She started iron infusion with Venofer for 3 weeks and she completed her treatment last week.  She is here today for evaluation and repeat blood work. ? ?MEDICAL HISTORY: ?Past Medical History:  ?Diagnosis Date  ? Anemia   ? Leukocytosis   ? Lupus erythematosus   ? Vitamin D deficiency   ? ? ?ALLERGIES:  has no allergies on file. ? ?MEDICATIONS:  ?Current Outpatient Medications  ?Medication Sig Dispense Refill  ? Cholecalciferol (VITAMIN D-3) 125 MCG (5000 UT) TABS Take 1 tablet by mouth daily at 2 PM.    ? diclofenac sodium (VOLTAREN) 1 % GEL Apply 4 g topically 4 (four) times daily. Prn (Patient not taking: Reported on 05/16/2021)    ? doxycycline (VIBRA-TABS) 100 MG tablet Take 1 tablet (100 mg total) by mouth 2 (two) times daily. (Patient not taking: Reported on 05/16/2021) 30 tablet 0  ? Ferrous Sulfate Dried (FEOSOL PO) Take 1 tablet by mouth daily.    ? ?No current facility-administered medications for this visit.  ? ? ?SURGICAL HISTORY:  ?Past Surgical History:  ?Procedure Laterality Date  ? CESAREAN SECTION    ? CESAREAN SECTION    ? ? ?REVIEW OF SYSTEMS:  A comprehensive review of systems was negative.  ? ?PHYSICAL EXAMINATION: General appearance: alert, cooperative, and no distress ?Head: Normocephalic,  without obvious abnormality, atraumatic ?Neck: no adenopathy, no JVD, supple, symmetrical, trachea midline, and thyroid not enlarged, symmetric, no tenderness/mass/nodules ?Lymph nodes: Cervical, supraclavicular, and axillary nodes normal. ?Resp: clear to auscultation bilaterally ?Back: symmetric, no curvature. ROM normal. No CVA tenderness. ?Cardio: regular rate and rhythm, S1, S2 normal, no murmur, click, rub or gallop ?GI: soft, non-tender; bowel sounds normal; no masses,  no organomegaly ?Extremities: extremities normal, atraumatic, no cyanosis or edema ? ?ECOG PERFORMANCE STATUS: 0 - Asymptomatic ? ?Blood pressure 132/78, pulse 90, temperature 97.9 ?F (36.6 ?C), temperature source Tympanic, resp. rate 19, height '5\' 7"'$  (1.702 m), weight 203 lb 8 oz (92.3 kg), SpO2 100 %. ? ?LABORATORY DATA: ?Lab Results  ?Component Value Date  ? WBC 14.9 (H) 06/13/2021  ? HGB 10.5 (L) 06/13/2021  ? HCT 31.8 (L) 06/13/2021  ? MCV 87.8 06/13/2021  ? PLT 276 06/13/2021  ? ? ?  Chemistry   ?   ?Component Value Date/Time  ? NA 139 05/16/2021 1123  ? K 4.1 05/16/2021 1123  ? CL 104 05/16/2021 1123  ? CO2 29 05/16/2021 1123  ? BUN 14 05/16/2021 1123  ? CREATININE 0.85 05/16/2021 1123  ?    ?Component Value Date/Time  ? CALCIUM 9.4 05/16/2021 1123  ? ALKPHOS 71 05/16/2021 1123  ? AST 15 05/16/2021 1123  ? ALT 13 05/16/2021 1123  ? BILITOT 0.3 05/16/2021 1123  ?  ? ? ? ?RADIOGRAPHIC STUDIES: ?No results  found. ? ?ASSESSMENT AND PLAN: This is a very pleasant 51 years old white female with stage 0 chronic lymphocytic leukemia in addition to iron deficiency anemia. ?The patient underwent iron infusion with Venofer 300 mg IV weekly for 3 weeks completed last week. ?She had repeat CBC today that showed persistent leukocytosis as well as mild anemia. ?I recommended for the patient to continue with the oral iron tablets for now. ?I will see her back for follow-up visit in 2 months for evaluation and repeat CBC, iron study and ferritin. ?The  patient was advised to call immediately if she has any other concerning symptoms in the interval. ?The patient voices understanding of current disease status and treatment options and is in agreement with the current care plan. ? ?All questions were answered. The patient knows to call the clinic with any problems, questions or concerns. We can certainly see the patient much sooner if necessary. ? ?The total time spent in the appointment was 20 minutes. ? ?Disclaimer: This note was dictated with voice recognition software. Similar sounding words can inadvertently be transcribed and may not be corrected upon review. ? ? ?  ?   ?

## 2021-06-20 ENCOUNTER — Other Ambulatory Visit: Payer: Self-pay | Admitting: Pharmacy Technician

## 2021-07-26 ENCOUNTER — Telehealth: Payer: Self-pay | Admitting: Internal Medicine

## 2021-07-26 NOTE — Telephone Encounter (Signed)
Called patient regarding upcoming appointments, patient is notified. 

## 2021-08-15 ENCOUNTER — Inpatient Hospital Stay: Payer: 59 | Attending: Internal Medicine

## 2021-08-15 ENCOUNTER — Other Ambulatory Visit: Payer: Self-pay

## 2021-08-15 ENCOUNTER — Inpatient Hospital Stay: Payer: 59 | Admitting: Internal Medicine

## 2021-08-15 VITALS — BP 143/73 | HR 91 | Temp 98.8°F | Resp 17 | Wt 207.2 lb

## 2021-08-15 DIAGNOSIS — D509 Iron deficiency anemia, unspecified: Secondary | ICD-10-CM | POA: Insufficient documentation

## 2021-08-15 DIAGNOSIS — C9110 Chronic lymphocytic leukemia of B-cell type not having achieved remission: Secondary | ICD-10-CM | POA: Diagnosis present

## 2021-08-15 DIAGNOSIS — D500 Iron deficiency anemia secondary to blood loss (chronic): Secondary | ICD-10-CM

## 2021-08-15 LAB — CBC WITH DIFFERENTIAL (CANCER CENTER ONLY)
Abs Immature Granulocytes: 0.04 10*3/uL (ref 0.00–0.07)
Basophils Absolute: 0.1 10*3/uL (ref 0.0–0.1)
Basophils Relative: 1 %
Eosinophils Absolute: 0.2 10*3/uL (ref 0.0–0.5)
Eosinophils Relative: 1 %
HCT: 34 % — ABNORMAL LOW (ref 36.0–46.0)
Hemoglobin: 10.9 g/dL — ABNORMAL LOW (ref 12.0–15.0)
Immature Granulocytes: 0 %
Lymphocytes Relative: 57 %
Lymphs Abs: 8.8 10*3/uL — ABNORMAL HIGH (ref 0.7–4.0)
MCH: 29.1 pg (ref 26.0–34.0)
MCHC: 32.1 g/dL (ref 30.0–36.0)
MCV: 90.7 fL (ref 80.0–100.0)
Monocytes Absolute: 1 10*3/uL (ref 0.1–1.0)
Monocytes Relative: 6 %
Neutro Abs: 5.5 10*3/uL (ref 1.7–7.7)
Neutrophils Relative %: 35 %
Platelet Count: 282 10*3/uL (ref 150–400)
RBC: 3.75 MIL/uL — ABNORMAL LOW (ref 3.87–5.11)
RDW: 14.6 % (ref 11.5–15.5)
WBC Count: 15.5 10*3/uL — ABNORMAL HIGH (ref 4.0–10.5)
nRBC: 0 % (ref 0.0–0.2)

## 2021-08-15 LAB — IRON AND IRON BINDING CAPACITY (CC-WL,HP ONLY)
Iron: 64 ug/dL (ref 28–170)
Saturation Ratios: 19 % (ref 10.4–31.8)
TIBC: 337 ug/dL (ref 250–450)
UIBC: 273 ug/dL (ref 148–442)

## 2021-08-15 LAB — FERRITIN: Ferritin: 57 ng/mL (ref 11–307)

## 2021-08-15 NOTE — Progress Notes (Signed)
?    [facility] ?Telephone:([phone]   [phone] ? ?OFFICE PROGRESS NOTE ? ?[name], MD ?[address] ?[place] [address] ? ?DIAGNOSIS:  ?1) Lymphocytosis consistent with CLL diagnosed in February 2023. ?2) iron deficient anemia ? ?PRIOR THERAPY: Iron infusion with Venofer 300 mg IV weekly for 3 weeks. ? ?CURRENT THERAPY: None ? ?INTERVAL HISTORY: ?Holly Sanders 51 y.o. female returns to the clinic today for follow-up visit.  The patient is feeling fine today with no concerning complaints except for very mild fatigue.  She denied having any current chest pain, shortness of breath, cough or hemoptysis.  She has no nausea, vomiting, diarrhea or constipation.  She has no headache or visual changes.  She tolerated her previous iron infusion fairly well.  She is here today for evaluation and repeat blood work. ? ?MEDICAL HISTORY: ?Past Medical History:  ?Diagnosis Date  ? Anemia   ? Leukocytosis   ? Lupus erythematosus   ? Vitamin D deficiency   ? ? ?ALLERGIES:  has no allergies on file. ? ?MEDICATIONS:  ?Current Outpatient Medications  ?Medication Sig Dispense Refill  ? Cholecalciferol (VITAMIN D-3) 125 MCG (5000 UT) TABS Take 1 tablet by mouth daily at 2 PM.    ? diclofenac sodium (VOLTAREN) 1 % GEL Apply 4 g topically 4 (four) times daily. Prn (Patient not taking: Reported on 05/16/2021)    ? Ferrous Sulfate Dried (FEOSOL PO) Take 1 tablet by mouth daily.    ? ?No current facility-administered medications for this visit.  ? ? ?SURGICAL HISTORY:  ?Past Surgical History:  ?Procedure Laterality Date  ? CESAREAN SECTION    ? CESAREAN SECTION    ? ? ?REVIEW OF SYSTEMS:  A comprehensive review of systems was negative except for: Constitutional: positive for fatigue  ? ?PHYSICAL EXAMINATION: General appearance: alert, cooperative, and no distress ?Head: Normocephalic, without obvious abnormality, atraumatic ?Neck: no adenopathy, no JVD, supple, symmetrical, trachea midline, and  thyroid not enlarged, symmetric, no tenderness/mass/nodules ?Lymph nodes: Cervical, supraclavicular, and axillary nodes normal. ?Resp: clear to auscultation bilaterally ?Back: symmetric, no curvature. ROM normal. No CVA tenderness. ?Cardio: regular rate and rhythm, S1, S2 normal, no murmur, click, rub or gallop ?GI: soft, non-tender; bowel sounds normal; no masses,  no organomegaly ?Extremities: extremities normal, atraumatic, no cyanosis or edema ? ?ECOG PERFORMANCE STATUS: 0 - Asymptomatic ? ?Blood pressure (!) 143/73, pulse 91, temperature 98.8 ?F (37.1 ?C), temperature source Oral, resp. rate 17, weight 207 lb 3 oz (94 kg), SpO2 100 %. ? ?LABORATORY DATA: ?Lab Results  ?Component Value Date  ? WBC 15.5 (H) 08/15/2021  ? HGB 10.9 (L) 08/15/2021  ? HCT 34.0 (L) 08/15/2021  ? MCV 90.7 08/15/2021  ? PLT 282 08/15/2021  ? ? ?  Chemistry   ?   ?Component Value Date/Time  ? NA 139 05/16/2021 1123  ? K 4.1 05/16/2021 1123  ? CL 104 05/16/2021 1123  ? CO2 29 05/16/2021 1123  ? BUN 14 05/16/2021 1123  ? CREATININE 0.85 05/16/2021 1123  ?    ?Component Value Date/Time  ? CALCIUM 9.4 05/16/2021 1123  ? ALKPHOS 71 05/16/2021 1123  ? AST 15 05/16/2021 1123  ? ALT 13 05/16/2021 1123  ? BILITOT 0.3 05/16/2021 1123  ?  ? ? ? ?RADIOGRAPHIC STUDIES: ?No results found. ? ?ASSESSMENT AND PLAN: This is a very pleasant 51 years old white female with stage 0 chronic lymphocytic leukemia in addition to iron deficiency anemia. ?The patient underwent  iron infusion with Venofer 300 mg IV weekly for 3 weeks completed and February 2023. ?The patient is currently on observation and she is feeling fine with no concerning complaints except for very mild fatigue. ?Repeat CBC today showed persistent mild anemia with hemoglobin of 10.9 and hematocrit 34.0%.  She also has persistent leukocytosis consistent with her diagnosis of CLL. ?I recommended for the patient to continue on observation with repeat CBC, iron study and ferritin in 3 months.  If  the pending iron studies showed significant iron deficiency, I would consider her for additional iron infusion with Venofer. ?She was advised to call immediately if she has any other concerning symptoms in the interval. ?The patient voices understanding of current disease status and treatment options and is in agreement with the current care plan. ? ?All questions were answered. The patient knows to call the clinic with any problems, questions or concerns. We can certainly see the patient much sooner if necessary. ? ? ?Disclaimer: This note was dictated with voice recognition software. Similar sounding words can inadvertently be transcribed and may not be corrected upon review. ? ? ?  ?   ?

## 2021-11-10 ENCOUNTER — Ambulatory Visit (INDEPENDENT_AMBULATORY_CARE_PROVIDER_SITE_OTHER): Payer: 59

## 2021-11-10 ENCOUNTER — Ambulatory Visit (INDEPENDENT_AMBULATORY_CARE_PROVIDER_SITE_OTHER): Payer: 59 | Admitting: Orthopaedic Surgery

## 2021-11-10 DIAGNOSIS — M25512 Pain in left shoulder: Secondary | ICD-10-CM | POA: Diagnosis not present

## 2021-11-10 DIAGNOSIS — G8929 Other chronic pain: Secondary | ICD-10-CM

## 2021-11-10 DIAGNOSIS — M7542 Impingement syndrome of left shoulder: Secondary | ICD-10-CM

## 2021-11-10 MED ORDER — METHYLPREDNISOLONE ACETATE 40 MG/ML IJ SUSP
40.0000 mg | INTRAMUSCULAR | Status: AC | PRN
  Administered 2021-11-10: 40 mg via INTRA_ARTICULAR

## 2021-11-10 MED ORDER — LIDOCAINE HCL 1 % IJ SOLN
3.0000 mL | INTRAMUSCULAR | Status: AC | PRN
  Administered 2021-11-10: 3 mL

## 2021-11-10 NOTE — Progress Notes (Signed)
Office Visit Note   Patient: Holly Sanders           Date of Birth: 20-Aug-1970           MRN: [id number] Visit Date: 11/10/2021              Requested by: [name], MD [address] [place],  [place] [address] PCP: [name], MD   Assessment & Plan: Visit Diagnoses:  1. Chronic left shoulder pain   2. Impingement syndrome of left shoulder     Plan: I did recommend a steroid injection in her left shoulder subacromial outlet which she agreed to and tolerated well.  She will work on stretching and range of motion program for her left shoulder.  I would like to see her back in 3 weeks.  At that point I may consider sending her for an ultrasound-guided intra-articular injection in her left shoulder joint depending on her clinical exam at that next visit and her progress.  Follow-Up Instructions: Return in about 3 weeks (around 12/01/2021).   Orders:  Orders Placed This Encounter  Procedures   XR Shoulder Left   No orders of the defined types were placed in this encounter.     Procedures: Large Joint Inj: L subacromial bursa on 11/10/2021 3:46 PM Indications: pain and diagnostic evaluation Details: 22 G 1.5 in needle  Arthrogram: No  Medications: 3 mL lidocaine 1 %; 40 mg methylPREDNISolone acetate 40 MG/ML Outcome: tolerated well, no immediate complications Procedure, treatment alternatives, risks and benefits explained, specific risks discussed. Consent was given by the patient. Immediately prior to procedure a time out was called to verify the correct patient, procedure, equipment, support staff and site/side marked as required. Patient was prepped and draped in the usual sterile fashion.       Clinical Data: No additional findings.   Subjective: Chief Complaint  Patient presents with   Left Shoulder - Pain  Holly Sanders is well-known to me.  She is actually a friend as well.  She has always been an athletic individual.  Several years ago she was having  right shoulder issues with impingement syndrome and I did place a steroid injection in the subacromial outlet and she worked on it on her own with therapy and she got her motion back completely and has no issues with the right shoulder.  Her left shoulder has been hurting her now for several months with no known injury.  She does play tennis and pickleball.  She reports significant soreness with decreased range of motion and decreased strength.  She says it is harder to reach behind her and overhead and it was similar to what she dealt with with her right shoulder.  She has never had shoulder surgery or an injection in her left shoulder.  She has been followed for CLL recently.  HPI  Review of Systems There is currently listed no fever, chills, nausea, vomiting  Objective: Vital Signs: There were no vitals taken for this visit.  Physical Exam She is alert and orient x3 and in no acute distress Ortho Exam Examination of her left shoulder definitely shows signs of impingement.  Her internal rotation with adduction reaching behind her is significant limited and is down to the level of the lower lumbar spine.  Her external rotation is also limited in her forward flexion is limited.  The shoulder is well located. Specialty Comments:  No specialty comments available.  Imaging: XR Shoulder Left  Result Date:  11/10/2021 3 views of the left shoulder show no acute findings.  There is arthritic changes of the [facility] joint.    PMFS History: Patient Active Problem List   Diagnosis Date Noted   CLL (chronic lymphocytic leukemia) ([place]) 06/13/2021   Iron deficiency anemia 05/16/2021   Lymphocytosis 05/16/2021   Primary osteoarthritis of both hands 09/21/2016   Primary osteoarthritis of both feet 09/21/2016   ANA positive 09/21/2016   Medial epicondylitis of elbow, right 09/21/2016   Past Medical History:  Diagnosis Date   Anemia    Leukocytosis    Lupus erythematosus    Vitamin D deficiency      Family History  Problem Relation Age of Onset   Heart disease Mother    Lymphoma Father    Squamous cell carcinoma Father    Basal cell carcinoma Father    Heart disease Brother     Past Surgical History:  Procedure Laterality Date   CESAREAN SECTION     CESAREAN SECTION     Social History   Occupational History   Not on file  Tobacco Use   Smoking status: Never   Smokeless tobacco: Never  Vaping Use   Vaping Use: Never used  Substance and Sexual Activity   Alcohol use: No   Drug use: No   Sexual activity: Not on file

## 2021-11-15 ENCOUNTER — Inpatient Hospital Stay (HOSPITAL_BASED_OUTPATIENT_CLINIC_OR_DEPARTMENT_OTHER): Payer: 59 | Admitting: Internal Medicine

## 2021-11-15 ENCOUNTER — Encounter: Payer: Self-pay | Admitting: Internal Medicine

## 2021-11-15 ENCOUNTER — Other Ambulatory Visit: Payer: Self-pay

## 2021-11-15 ENCOUNTER — Inpatient Hospital Stay: Payer: 59 | Attending: Internal Medicine

## 2021-11-15 VITALS — BP 136/81 | HR 76 | Temp 98.5°F | Resp 16 | Ht 67.0 in | Wt 198.2 lb

## 2021-11-15 DIAGNOSIS — D509 Iron deficiency anemia, unspecified: Secondary | ICD-10-CM | POA: Insufficient documentation

## 2021-11-15 DIAGNOSIS — C9110 Chronic lymphocytic leukemia of B-cell type not having achieved remission: Secondary | ICD-10-CM

## 2021-11-15 LAB — CBC WITH DIFFERENTIAL (CANCER CENTER ONLY)
Abs Immature Granulocytes: 0.04 10*3/uL (ref 0.00–0.07)
Basophils Absolute: 0.1 10*3/uL (ref 0.0–0.1)
Basophils Relative: 0 %
Eosinophils Absolute: 0.2 10*3/uL (ref 0.0–0.5)
Eosinophils Relative: 1 %
HCT: 34.3 % — ABNORMAL LOW (ref 36.0–46.0)
Hemoglobin: 11.3 g/dL — ABNORMAL LOW (ref 12.0–15.0)
Immature Granulocytes: 0 %
Lymphocytes Relative: 57 %
Lymphs Abs: 9.4 10*3/uL — ABNORMAL HIGH (ref 0.7–4.0)
MCH: 28.8 pg (ref 26.0–34.0)
MCHC: 32.9 g/dL (ref 30.0–36.0)
MCV: 87.5 fL (ref 80.0–100.0)
Monocytes Absolute: 0.8 10*3/uL (ref 0.1–1.0)
Monocytes Relative: 5 %
Neutro Abs: 6.2 10*3/uL (ref 1.7–7.7)
Neutrophils Relative %: 37 %
Platelet Count: 333 10*3/uL (ref 150–400)
RBC: 3.92 MIL/uL (ref 3.87–5.11)
RDW: 13.3 % (ref 11.5–15.5)
Smear Review: NORMAL
WBC Count: 16.7 10*3/uL — ABNORMAL HIGH (ref 4.0–10.5)
nRBC: 0 % (ref 0.0–0.2)

## 2021-11-15 LAB — IRON AND IRON BINDING CAPACITY (CC-WL,HP ONLY)
Iron: 35 ug/dL (ref 28–170)
Saturation Ratios: 9 % — ABNORMAL LOW (ref 10.4–31.8)
TIBC: 412 ug/dL (ref 250–450)
UIBC: 377 ug/dL (ref 148–442)

## 2021-11-15 LAB — FERRITIN: Ferritin: 17 ng/mL (ref 11–307)

## 2021-11-15 NOTE — Progress Notes (Signed)
[place] Telephone:(336) [phone]   Fax:(336) [phone]  OFFICE PROGRESS NOTE  [name], MD [place] [address]  DIAGNOSIS:  1) Lymphocytosis consistent with CLL diagnosed in February 2023. 2) iron deficient anemia  PRIOR THERAPY: Iron infusion with Venofer 300 mg IV weekly for 3 weeks.  CURRENT THERAPY: None  INTERVAL HISTORY: Holly Sanders 51 y.o. female returns to the clinic today for follow-up visit.  The patient is feeling fine today with no concerning complaints except for mild fatigue.  She has no chest pain, shortness of breath, cough or hemoptysis.  She has no palpable lymphadenopathy.  She has no bleeding, bruises or ecchymosis.  She has no headache or visual changes.  She has no nausea, vomiting, diarrhea or constipation.  She is here today for evaluation with repeat blood work.  MEDICAL HISTORY: Past Medical History:  Diagnosis Date   Anemia    Leukocytosis    Lupus erythematosus    Vitamin D deficiency     ALLERGIES:  has no allergies on file.  MEDICATIONS:  Current Outpatient Medications  Medication Sig Dispense Refill   Cholecalciferol (VITAMIN D-3) 125 MCG (5000 UT) TABS Take 1 tablet by mouth daily at 2 PM.     diclofenac sodium (VOLTAREN) 1 % GEL Apply 4 g topically 4 (four) times daily. Prn (Patient not taking: Reported on 05/16/2021)     Ferrous Sulfate Dried (FEOSOL PO) Take 1 tablet by mouth daily.     No current facility-administered medications for this visit.    SURGICAL HISTORY:  Past Surgical History:  Procedure Laterality Date   CESAREAN SECTION     CESAREAN SECTION      REVIEW OF SYSTEMS:  A comprehensive review of systems was negative except for: Constitutional: positive for fatigue   PHYSICAL EXAMINATION: General appearance: alert, cooperative, and no distress Head: Normocephalic, without obvious abnormality, atraumatic Neck: no adenopathy, no JVD, supple, symmetrical, trachea midline,  and thyroid not enlarged, symmetric, no tenderness/mass/nodules Lymph nodes: Cervical, supraclavicular, and axillary nodes normal. Resp: clear to auscultation bilaterally Back: symmetric, no curvature. ROM normal. No CVA tenderness. Cardio: regular rate and rhythm, S1, S2 normal, no murmur, click, rub or gallop GI: soft, non-tender; bowel sounds normal; no masses,  no organomegaly Extremities: extremities normal, atraumatic, no cyanosis or edema  ECOG PERFORMANCE STATUS: 0 - Asymptomatic  Blood pressure 136/81, pulse 76, temperature 98.5 F (36.9 C), temperature source Oral, resp. rate 16, height '5\' 7"'$  (1.702 m), weight 198 lb 3.2 oz (89.9 kg), SpO2 100 %.  LABORATORY DATA: Lab Results  Component Value Date   WBC 15.5 (H) 08/15/2021   HGB 10.9 (L) 08/15/2021   HCT 34.0 (L) 08/15/2021   MCV 90.7 08/15/2021   PLT 282 08/15/2021      Chemistry      Component Value Date/Time   NA 139 05/16/2021 1123   K 4.1 05/16/2021 1123   CL 104 05/16/2021 1123   CO2 29 05/16/2021 1123   BUN 14 05/16/2021 1123   CREATININE 0.85 05/16/2021 1123      Component Value Date/Time   CALCIUM 9.4 05/16/2021 1123   ALKPHOS 71 05/16/2021 1123   AST 15 05/16/2021 1123   ALT 13 05/16/2021 1123   BILITOT 0.3 05/16/2021 1123       RADIOGRAPHIC STUDIES: XR Shoulder Left  Result Date: 11/10/2021 3 views of the left shoulder show no acute findings.  There is arthritic changes of the [facility] joint.  ASSESSMENT AND PLAN: This is a very pleasant 51 years old white female with stage 0 chronic lymphocytic leukemia in addition to iron deficiency anemia. The patient underwent iron infusion with Venofer 300 mg IV weekly for 3 weeks completed and February 2023. The patient is currently on observation and she is feeling fine with no concerning complaints except for mild fatigue. She had a steroid injection to the shoulder recently. Repeat CBC showed persistent leukocytosis with further elevation compared to few  months ago but this could be secondary to her treatment with the steroid. Iron study and ferritin are still pending. I recommended for the patient to continue on observation with repeat blood work in 6 months. The patient was advised to call immediately if she has any other concerning symptoms in the interval. The patient voices understanding of current disease status and treatment options and is in agreement with the current care plan.  All questions were answered. The patient knows to call the clinic with any problems, questions or concerns. We can certainly see the patient much sooner if necessary.   Disclaimer: This note was dictated with voice recognition software. Similar sounding words can inadvertently be transcribed and may not be corrected upon review.

## 2021-12-01 ENCOUNTER — Ambulatory Visit (INDEPENDENT_AMBULATORY_CARE_PROVIDER_SITE_OTHER): Payer: 59 | Admitting: Orthopaedic Surgery

## 2021-12-01 ENCOUNTER — Other Ambulatory Visit: Payer: Self-pay

## 2021-12-01 ENCOUNTER — Encounter: Payer: Self-pay | Admitting: Orthopaedic Surgery

## 2021-12-01 DIAGNOSIS — M25512 Pain in left shoulder: Secondary | ICD-10-CM

## 2021-12-01 DIAGNOSIS — G8929 Other chronic pain: Secondary | ICD-10-CM

## 2021-12-01 DIAGNOSIS — M7542 Impingement syndrome of left shoulder: Secondary | ICD-10-CM

## 2021-12-01 NOTE — Progress Notes (Signed)
Holly Sanders comes in today for continued follow-up as it relates to her left shoulder.  She had a history of significant decreased motion with the right shoulder over a year ago and between her moving her shoulder on her own and just a home exercise program combined with a subacromial injection, she got her motion all back.  She is an avid Doctor, general practice and is reporting still significant decreased motion of the left shoulder.  When I saw her 3 weeks ago I did provide a steroid injection in her subacromial outlet.  She still has significant limitations with forward flexion and abduction of her left shoulder.  Her internal rotation with adduction is also significant limited.  It is essential we get her into physical therapy for her left shoulder as well as I would like to send her to Dr. Rolena Infante for an ultrasound assessment of her left shoulder and an ultrasound-guided intra-articular injection in the left shoulder glenohumeral joint.  She agrees with this treatment plan.  We will work on getting things set up.

## 2021-12-05 ENCOUNTER — Ambulatory Visit: Payer: Self-pay

## 2021-12-05 ENCOUNTER — Ambulatory Visit (INDEPENDENT_AMBULATORY_CARE_PROVIDER_SITE_OTHER): Payer: 59 | Admitting: Sports Medicine

## 2021-12-05 ENCOUNTER — Encounter: Payer: Self-pay | Admitting: Sports Medicine

## 2021-12-05 VITALS — BP 127/85 | HR 80 | Ht 67.0 in | Wt 198.2 lb

## 2021-12-05 DIAGNOSIS — M25512 Pain in left shoulder: Secondary | ICD-10-CM | POA: Diagnosis not present

## 2021-12-05 DIAGNOSIS — G8929 Other chronic pain: Secondary | ICD-10-CM | POA: Diagnosis not present

## 2021-12-05 DIAGNOSIS — M7530 Calcific tendinitis of unspecified shoulder: Secondary | ICD-10-CM

## 2021-12-05 NOTE — Progress Notes (Signed)
Office Visit Note   Patient: Holly Sanders           Date of Birth: 1970-06-26           MRN: [id number] Visit Date: 12/05/2021              Requested by: [name], MD [address],  [place] [address] PCP: [name], MD  Subjective: Holly Sanders is a pleasant 51 year old female who presents for evaluation of left shoulder pain.  She did see Dr. Ninfa Linden for this and had a subacromial joint injection which initially provided good relief, however recently has had some stiffness and issues using the shoulder.  She is an avid Doctor, general practice.  She has begun undergoing physical therapy to work on range of motion.  Assessment & Plan: Visit Diagnoses:  1. Chronic left shoulder pain   2. Calcific supraspinatus tendinitis    Plan: Discussed with Mary-Ann the ultrasound findings which show calcific tendinopathy of her supraspinatus tendon, likely indicative of a chronic partial tear.  She also has some degenerative changes of her labrum, which appear what I would expect for a patient of her age.  Her supraspinatus tendon does correlate to some of her painful examination maneuvers, however does not explain her limitation in both active and passive range of motion.  We did proceed with an ultrasound-guided glenohumeral joint injection, patient tolerated well.  We will see if this does help improve her pain and range of motion.  I encouraged her to continue wall walks, pendulum swings and spokes of the wheel without weight at this time.  Encouraged her to get into schedule physical therapy as Dr. Ninfa Linden recommended.  We will have her follow-up with Dr. Ninfa Linden as indicated.  Follow-Up Instructions: Follow-up with Dr. Ninfa Linden as indicated; I am happy to see her as needed   Objective: Vital Signs: BP 127/85 (BP Location: Left Arm, Patient Position: Sitting, Cuff Size: Large)   Pulse 80   Ht '5\' 7"'$  (1.702 m)   Wt 198 lb 3.2 oz (89.9 kg)   BMI 31.04 kg/m   Physical Exam Gen:  Well-appearing, in no acute distress; non-toxic CV: Regular Rate. Well-perfused. Warm.  Resp: Breathing unlabored on room air; no wheezing. Psych: Fluid speech in conversation; appropriate affect; normal thought process Neuro: Sensation intact throughout. No gross coordination deficits.    Ortho Exam  - Left shoulder: No overlying skin changes, redness or effusion noted.  There is restriction in both active and passive range of motion in forward flexion as well as abduction and internal rotation.  Active forward flexion to approximately 110 degrees, abduction 80 degrees before notable scapular compensation.  I am able to take her about 10-15 degrees further passively, however still limited.  Thumb to L3 before restriction in internal rotation.   Imaging: [place] Extrem Up Left Comp  Result Date: 12/05/2021 [facility] Complete US of Shoulder, left Patient was seated on exam table and shoulder US examination was performed using high frequency linear probe. -The biceps tendon was visualized within the bicipital groove in both longitudinal and transverse axis without evidence of fluid within the sheath nor tendon tearing. -Subscapularis was identified with intact tendon without some articular cortical irregularity near the midportion anteriorly. -The [facility] joint was visualized with mild-moderate osteoarthritis and some narrowing, without bursal distention -The supraspinatus was identified in both the transverse and oblique plane.  Ultrasound demonstrates hyperechoic calcification within the mid portion of the insertion of the supraspinatus.  There is no  significant hyperemia, likely indicative of more chronic partial tearing.  Calcification spans approximately 60% of the tendon width.  There is evidence of intact tendon on both sides of the calcification. -The infraspinatus tendon and teres minor tendons were identified without evidence of tearing or irregularity. -The posterior glenohumeral joint was identified in the  transverse plane.  There is some evidence of degenerative labral tearing, no significant joint effusion or significant OA on limited US view.   Mild-Moderate AC joint OA; Mild degenerative tearing of posterior labrum. Most significantly, there is calcification within the mid-insertion point of the supraspinatus tendon likely indicative of chronic partial tear spanning about 60% of tendon width. Imaging also demonstrates technically successful GHJ injection.    Procedure: US-guided glenohumeral joint injection, left shoulder After informed verbal consent and discussion on risks/benefits/indications was given, a timeout was performed, patient was lying lateral recumbent on exam table. The patient's left shoulder was prepped with multiple alcohol swabs and utilizing ultrasound guidance, the patient's glenohumeral joint was identified on ultrasound. Using ultrasound guidance a 22-gauge, 3.5 inch needle with a mixture of 4:1 cc's lidocaine:Methylprednisolone was directed from a lateral to medial direction via in-plane technique into the glenohumeral joint with visualization of appropriate spread of injectate into the joint. Patient tolerated the procedure well without immediate complications.

## 2021-12-16 ENCOUNTER — Telehealth: Payer: Self-pay | Admitting: Internal Medicine

## 2021-12-16 ENCOUNTER — Encounter: Payer: Self-pay | Admitting: Internal Medicine

## 2021-12-16 NOTE — Telephone Encounter (Signed)
I will take her as a patient.  I called her.  She does not have any upper GI symptoms (dysphagia abdominal pain eating problems or signs of upper GI bleeding).  She has iron deficiency anemia but has had many many years of heavy menses.  Probably going through menopause now.  But was never able to donate blood throughout the years due to always having some low iron or anemia it seems.  So I do not think she needs an EGD.   Plan:  Schedule previsit appointment and colonoscopy appointment for screening with me

## 2021-12-16 NOTE — Telephone Encounter (Signed)
Good Afternoon Dr. Carlean Purl,   Patient called stating that she has a referral in from her PCP Dr. Ernie Hew and her Oncologist Dr. Julien Nordmann for a colonoscopy. Patient stated that she personally knows you and she would like for you to do her colonoscopy because she has never had one before. Patient was recently diagnosed with CLL and also is  having issues with anemia. Are you willing to accept her as a patient?  Please advise.

## 2021-12-20 NOTE — Telephone Encounter (Signed)
LVM for patient to call back to schedule colonoscopy with Dr. Carlean Purl.

## 2021-12-21 ENCOUNTER — Other Ambulatory Visit: Payer: Self-pay | Admitting: Family Medicine

## 2021-12-21 DIAGNOSIS — N950 Postmenopausal bleeding: Secondary | ICD-10-CM

## 2021-12-23 ENCOUNTER — Ambulatory Visit
Admission: RE | Admit: 2021-12-23 | Discharge: 2021-12-23 | Disposition: A | Discharge status: Home / Self Care | Payer: 59 | Source: Ambulatory Visit | Attending: Family Medicine | Admitting: Family Medicine

## 2021-12-23 DIAGNOSIS — N950 Postmenopausal bleeding: Secondary | ICD-10-CM

## 2021-12-27 ENCOUNTER — Encounter: Payer: Self-pay | Admitting: Internal Medicine

## 2022-01-31 ENCOUNTER — Ambulatory Visit (AMBULATORY_SURGERY_CENTER): Payer: Self-pay

## 2022-01-31 VITALS — Ht 67.0 in | Wt 196.0 lb

## 2022-01-31 DIAGNOSIS — Z1211 Encounter for screening for malignant neoplasm of colon: Secondary | ICD-10-CM

## 2022-01-31 MED ORDER — NA SULFATE-K SULFATE-MG SULF 17.5-3.13-1.6 GM/177ML PO SOLN: 1.0000 | Freq: Once | ORAL | 0 refills | Status: AC

## 2022-01-31 NOTE — Progress Notes (Signed)
No egg or soy allergy known to patient  No issues known to pt with past sedation with any surgeries or procedures Patient denies ever being told they had issues or difficulty with intubation  No FH of Malignant Hyperthermia Pt is not on diet pills Pt is not on  home 02  Pt is not on blood thinners  Pt denies issues with constipation  No A fib or A flutter Have any cardiac testing pending--no Pt instructed to use Singlecare.com or GoodRx for a price reduction on prep  Patient's chart reviewed by Holly Sanders CNRA prior to previsit and patient appropriate for the [facility].  Previsit completed and red dot placed by patient's name on their procedure day (on provider's schedule).    

## 2022-02-08 DIAGNOSIS — C189 Malignant neoplasm of colon, unspecified: Secondary | ICD-10-CM

## 2022-02-08 HISTORY — DX: Malignant neoplasm of colon, unspecified: C18.9

## 2022-02-14 ENCOUNTER — Encounter: Payer: 59 | Admitting: Internal Medicine

## 2022-03-07 ENCOUNTER — Other Ambulatory Visit: Payer: Self-pay

## 2022-03-07 ENCOUNTER — Ambulatory Visit (AMBULATORY_SURGERY_CENTER): Payer: 59 | Admitting: Internal Medicine

## 2022-03-07 ENCOUNTER — Other Ambulatory Visit (INDEPENDENT_AMBULATORY_CARE_PROVIDER_SITE_OTHER): Payer: 59

## 2022-03-07 ENCOUNTER — Encounter: Payer: Self-pay | Admitting: Internal Medicine

## 2022-03-07 ENCOUNTER — Other Ambulatory Visit: Payer: Self-pay | Admitting: *Deleted

## 2022-03-07 VITALS — BP 134/80 | HR 85 | Temp 96.6°F | Resp 15 | Ht 67.0 in | Wt 196.0 lb

## 2022-03-07 DIAGNOSIS — D500 Iron deficiency anemia secondary to blood loss (chronic): Secondary | ICD-10-CM | POA: Diagnosis not present

## 2022-03-07 DIAGNOSIS — C180 Malignant neoplasm of cecum: Secondary | ICD-10-CM | POA: Diagnosis not present

## 2022-03-07 DIAGNOSIS — Z1211 Encounter for screening for malignant neoplasm of colon: Secondary | ICD-10-CM | POA: Diagnosis present

## 2022-03-07 DIAGNOSIS — K6389 Other specified diseases of intestine: Secondary | ICD-10-CM

## 2022-03-07 HISTORY — PX: COLONOSCOPY: SHX174

## 2022-03-07 LAB — CBC WITH DIFFERENTIAL/PLATELET
Basophils Absolute: 0.1 10*3/uL (ref 0.0–0.1)
Basophils Relative: 0.7 % (ref 0.0–3.0)
Eosinophils Absolute: 0.1 10*3/uL (ref 0.0–0.7)
Eosinophils Relative: 0.6 % (ref 0.0–5.0)
HCT: 34.9 % — ABNORMAL LOW (ref 36.0–46.0)
Hemoglobin: 11.2 g/dL — ABNORMAL LOW (ref 12.0–15.0)
Lymphocytes Relative: 54.8 % — ABNORMAL HIGH (ref 12.0–46.0)
Lymphs Abs: 7.1 10*3/uL — ABNORMAL HIGH (ref 0.7–4.0)
MCHC: 32.3 g/dL (ref 30.0–36.0)
MCV: 86.3 fl (ref 78.0–100.0)
Monocytes Absolute: 0.7 10*3/uL (ref 0.1–1.0)
Monocytes Relative: 5.2 % (ref 3.0–12.0)
Neutro Abs: 5.1 10*3/uL (ref 1.4–7.7)
Neutrophils Relative %: 38.7 % — ABNORMAL LOW (ref 43.0–77.0)
Platelets: 388 10*3/uL (ref 150.0–400.0)
RBC: 4.04 Mil/uL (ref 3.87–5.11)
RDW: 14.6 % (ref 11.5–15.5)
WBC: 13.1 10*3/uL — ABNORMAL HIGH (ref 4.0–10.5)

## 2022-03-07 LAB — COMPREHENSIVE METABOLIC PANEL
ALT: 14 U/L (ref 0–35)
AST: 16 U/L (ref 0–37)
Albumin: 4.5 g/dL (ref 3.5–5.2)
Alkaline Phosphatase: 71 U/L (ref 39–117)
BUN: 9 mg/dL (ref 6–23)
CO2: 24 mEq/L (ref 19–32)
Calcium: 8.9 mg/dL (ref 8.4–10.5)
Chloride: 103 mEq/L (ref 96–112)
Creatinine, Ser: 0.79 mg/dL (ref 0.40–1.20)
GFR: 85.47 mL/min (ref >60.00)
Glucose, Bld: 88 mg/dL (ref 70–99)
Potassium: 3.8 mEq/L (ref 3.5–5.1)
Sodium: 139 mEq/L (ref 135–145)
Total Bilirubin: 0.4 mg/dL (ref 0.2–1.2)
Total Protein: 7.7 g/dL (ref 6.0–8.3)

## 2022-03-07 LAB — FERRITIN: Ferritin: 10.7 ng/mL (ref 10.0–291.0)

## 2022-03-07 MED ORDER — SODIUM CHLORIDE 0.9 % IV SOLN: 500.0000 mL | Freq: Once | INTRAVENOUS | Status: DC

## 2022-03-07 NOTE — Progress Notes (Signed)
[facility] History and Physical   Primary Care Physician:  Curt Bears, MD   Reason for Procedure:   CRCA screening  Plan:    colonoscopy     HPI: Holly Sanders is a 51 y.o. female presenting for screening exam.  Did have hx of menorrhagia and associated iron deficiency anemia - treated w/ Venofer   Past Medical History:  Diagnosis Date   Anemia    Cancer ([place])    CLL   Leukocytosis    Lupus erythematosus    Vitamin D deficiency     Past Surgical History:  Procedure Laterality Date   CESAREAN SECTION     COLONOSCOPY  03/07/2022    Prior to Admission medications   Medication Sig Start Date End Date Taking? Authorizing Provider  Cholecalciferol (VITAMIN D-3) 125 MCG (5000 UT) TABS Take 1 tablet by mouth daily at 2 PM.    [provider]  diclofenac sodium (VOLTAREN) 1 % GEL Apply 4 g topically 4 (four) times daily. Prn    [provider]  Ferrous Sulfate Dried (FEOSOL PO) Take 1 tablet by mouth daily.    [provider]    Current Outpatient Medications  Medication Sig Dispense Refill   Cholecalciferol (VITAMIN D-3) 125 MCG (5000 UT) TABS Take 1 tablet by mouth daily at 2 PM.     diclofenac sodium (VOLTAREN) 1 % GEL Apply 4 g topically 4 (four) times daily. Prn     Ferrous Sulfate Dried (FEOSOL PO) Take 1 tablet by mouth daily.     Current Facility-Administered Medications  Medication Dose Route Frequency Provider Last Rate Last Admin   0.9 %  sodium chloride infusion  500 mL Intravenous Once Gatha Mayer, MD        Allergies as of 03/07/2022   (No Known Allergies)    Family History  Problem Relation Age of Onset   Heart disease Mother    Lymphoma Father    Squamous cell carcinoma Father    Basal cell carcinoma Father    Heart disease Brother    Colon polyps Paternal Grandmother    Colon cancer Paternal Grandmother    Esophageal cancer Paternal Grandfather    Rectal cancer Neg Hx    Stomach cancer Neg Hx      Social History   Socioeconomic History   Marital status: Legally Separated    Spouse name: Not on file   Number of children: Not on file   Years of education: Not on file   Highest education level: Not on file  Occupational History   Not on file  Tobacco Use   Smoking status: Never   Smokeless tobacco: Never  Vaping Use   Vaping Use: Never used  Substance and Sexual Activity   Alcohol use: No   Drug use: No   Sexual activity: Not on file  Other Topics Concern   Not on file  Social History Narrative   Not on file   Social Determinants of Health   Financial Resource Strain: Not on file  Food Insecurity: Not on file  Transportation Needs: Not on file  Physical Activity: Not on file  Stress: Not on file  Social Connections: Not on file  Intimate Partner Violence: Not on file    Review of Systems:  All other review of systems negative except as mentioned in the HPI.  Physical Exam: Vital signs BP 120/76   Pulse 94   Temp (!) 96.6 F (35.9 C) (Temporal)   Ht  $'5\' 7"'W$  (1.702 m)   Wt 196 lb (88.9 kg)   LMP 03/03/2022 (Exact Date)   SpO2 100%   BMI 30.70 kg/m   General:   Alert,  Well-developed, well-nourished, pleasant and cooperative in NAD Lungs:  Clear throughout to auscultation.   Heart:  Regular rate and rhythm; no murmurs, clicks, rubs,  or gallops. Abdomen:  Soft, nontender and nondistended. Normal bowel sounds.   Neuro/Psych:  Alert and cooperative. Normal mood and affect. A and O x 3   '@Holly Hellenbrand'$  Simonne Maffucci, MD, [facility] [phone] (pager) 03/07/2022 1:38 PM@

## 2022-03-07 NOTE — Patient Instructions (Addendum)
There is a mass in the beginning of the colin that is unfortunately consistent with cancer. I took biopsies and marked the area with a tattoo. I will let you know biopsy results as soon as they return (1-2 days). I do not think related to the lymphoma though I suppose possible. It looks more like a cancer thjat started in the colon.  Labs will be drawn today, also. My office will be contacting you about appointments for CT scans and I will let Dr. Julien Nordmann know also, and Dr. Ernie Hew.  Dr. Julien Nordmann does not typically treat colon cancer so you may end up with another oncologist. We will sort that out.  Please resume your normal diet and medications.  I appreciate the opportunity to care for you. Gatha Mayer, MD, [facility]   Await pathology results from the biopsies taken today.  Resume previous diet and medications.will plan next steps after pathology results but will anticipate CT of chest/abdomen and pelvis with contrast.  YOU HAD AN ENDOSCOPIC PROCEDURE TODAY AT [facility]:   Refer to the procedure report that was given to you for any specific questions about what was found during the examination.  If the procedure report does not answer your questions, please call your gastroenterologist to clarify.  If you requested that your care partner not be given the details of your procedure findings, then the procedure report has been included in a sealed envelope for you to review at your convenience later.  YOU SHOULD EXPECT: Some feelings of bloating in the abdomen. Passage of more gas than usual.  Walking can help get rid of the air that was put into your GI tract during the procedure and reduce the bloating. If you had a lower endoscopy (such as a colonoscopy or flexible sigmoidoscopy) you may notice spotting of blood in your stool or on the toilet paper. If you underwent a bowel prep for your procedure, you may not have a normal bowel movement for a few days.  Please Note:  You might  notice some irritation and congestion in your nose or some drainage.  This is from the oxygen used during your procedure.  There is no need for concern and it should clear up in a day or so.  SYMPTOMS TO REPORT IMMEDIATELY:  Following lower endoscopy (colonoscopy or flexible sigmoidoscopy):  Excessive amounts of blood in the stool  Significant tenderness or worsening of abdominal pains  Swelling of the abdomen that is new, acute  Fever of 100F or higher   For urgent or emergent issues, a gastroenterologist can be reached at any hour by calling [phone]. Do not use MyChart messaging for urgent concerns.    DIET:  We do recommend a small meal at first, but then you may proceed to your regular diet.  Drink plenty of fluids but you should avoid alcoholic beverages for 24 hours.  ACTIVITY:  You should plan to take it easy for the rest of today and you should NOT DRIVE or use heavy machinery until tomorrow (because of the sedation medicines used during the test).    FOLLOW UP: Our staff will call the number listed on your records the next business day following your procedure.  We will call around 7:15- 8:00 am to check on you and address any questions or concerns that you may have regarding the information given to you following your procedure. If we do not reach you, we will leave a message.     If any  biopsies were taken you will be contacted by phone or by letter within the next 1-3 weeks.  Please call us at [phone] if you have not heard about the biopsies in 3 weeks.    SIGNATURES/CONFIDENTIALITY: You and/or your care partner have signed paperwork which will be entered into your electronic medical record.  These signatures attest to the fact that that the information above on your After Visit Summary has been reviewed and is understood.  Full responsibility of the confidentiality of this discharge information lies with you and/or your care-partner.

## 2022-03-07 NOTE — Progress Notes (Signed)
Pt's states no medical or surgical changes since previsit or office visit. 

## 2022-03-07 NOTE — Progress Notes (Signed)
Called to room to assist during endoscopic procedure.  Patient ID and intended procedure confirmed with present staff. Received instructions for my participation in the procedure from the performing physician.  

## 2022-03-07 NOTE — Op Note (Signed)
[facility] Patient Name: Holly Sanders Procedure Date: 03/07/2022 1:38 PM MRN: [id number] Endoscopist: Gatha Mayer , MD, [id number] Age: 51 Referring MD:  Date of Birth: 10/27/1970 Gender: Female Account #: [id number] Procedure:                Colonoscopy Indications:              Screening for colorectal malignant neoplasm Medicines:                Monitored Anesthesia Care Procedure:                Pre-Anesthesia Assessment:                           - Prior to the procedure, a History and Physical                            was performed, and patient medications and                            allergies were reviewed. The patient's tolerance of                            previous anesthesia was also reviewed. The risks                            and benefits of the procedure and the sedation                            options and risks were discussed with the patient.                            All questions were answered, and informed consent                            was obtained. Prior Anticoagulants: The patient has                            taken no anticoagulant or antiplatelet agents. ASA                            Grade Assessment: II - A patient with mild systemic                            disease. After reviewing the risks and benefits,                            the patient was deemed in satisfactory condition to                            undergo the procedure.                           After obtaining informed consent, the colonoscope  was passed under direct vision. Throughout the                            procedure, the patient's blood pressure, pulse, and                            oxygen saturations were monitored continuously. The                            Olympus Scope [id number] was introduced through the                            anus and advanced to the the cecum, identified by                            appendiceal  orifice and ileocecal valve. The                            colonoscopy was performed without difficulty. The                            patient tolerated the procedure well. The quality                            of the bowel preparation was excellent. The                            ileocecal valve, appendiceal orifice, and rectum                            were photographed. The bowel preparation used was                            SUPREP via split dose instruction. Scope In: 1:46:20 PM Scope Out: 1:57:36 PM Scope Withdrawal Time: 0 hours 7 minutes 46 seconds  Total Procedure Duration: 0 hours 11 minutes 16 seconds  Findings:                 The perianal and digital rectal examinations were                            normal.                           An infiltrative, polypoid and ulcerated                            non-obstructing large mass was found in the cecum.                            The mass was circumferential. This was biopsied                            with a cold forceps for histology. Verification of  patient identification for the specimen was done.                            Estimated blood loss was minimal. Area was tattooed                            with an injection of 4 mL of Spot (carbon black).                           The exam was otherwise without abnormality on                            direct and retroflexion views. Complications:            No immediate complications. Estimated Blood Loss:     Estimated blood loss was minimal. 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 Biopsied. Tattooed.                           - The examination was otherwise normal on direct                            and retroflexion views. Recommendation:           - Patient has a contact number available for                            emergencies. The signs and symptoms of potential                            delayed complications were  discussed with the                            patient. Return to normal activities tomorrow.                            Written discharge instructions were provided to the                            patient.                           - Resume previous diet.                           - Continue present medications.                           - Await pathology results.                           - CBC, CMET, CEA and ferritin today                           Will plan next steps after pathology in but  anticipate CT chest/abdomen and pelvis w/ contrast                           Will notify her oncologist Dr. Julien Nordmann after path                            in and refer to surgery then also. She has CLL but                            this mass looks like adenocarcinoma though lymphoma                            not excluded at this point Gatha Mayer, MD 03/07/2022 2:09:51 PM This report has been signed electronically.

## 2022-03-07 NOTE — Progress Notes (Signed)
Report to PACU, RN, vss, BBS= Clear.  

## 2022-03-08 ENCOUNTER — Telehealth: Payer: Self-pay

## 2022-03-08 LAB — CEA
CEA: <2 ng/mL
CEA: <2 ng/mL

## 2022-03-08 NOTE — Telephone Encounter (Signed)
  Follow up Call-     03/07/2022   12:41 PM  Call back number  Post procedure Call Back phone  # [phone]  Permission to leave phone message Yes     Patient questions:  Do you have a fever, pain , or abdominal swelling? No. Pain Score  0 *  Have you tolerated food without any problems? Yes.    Have you been able to return to your normal activities? Yes.    Do you have any questions about your discharge instructions: Diet   No. Medications  No. Follow up visit  No.  Do you have questions or concerns about your Care? No.  Actions: * If pain score is 4 or above: No action needed, pain <4.

## 2022-03-09 ENCOUNTER — Other Ambulatory Visit: Payer: Self-pay

## 2022-03-09 ENCOUNTER — Telehealth: Payer: Self-pay | Admitting: Internal Medicine

## 2022-03-09 DIAGNOSIS — C189 Malignant neoplasm of colon, unspecified: Secondary | ICD-10-CM

## 2022-03-09 NOTE — Telephone Encounter (Signed)
Patient stated she was returning your call, please advise.

## 2022-03-10 ENCOUNTER — Telehealth: Payer: Self-pay | Admitting: Nurse Practitioner

## 2022-03-10 NOTE — Telephone Encounter (Signed)
Called patient to notify of new upcoming appointments. Patient notified

## 2022-03-10 NOTE — Telephone Encounter (Signed)
Left message for pt to call back:  Will document in result notes

## 2022-03-16 ENCOUNTER — Inpatient Hospital Stay: Payer: 59 | Attending: Nurse Practitioner | Admitting: Nurse Practitioner

## 2022-03-16 ENCOUNTER — Encounter: Payer: Self-pay | Admitting: Nurse Practitioner

## 2022-03-16 ENCOUNTER — Ambulatory Visit (HOSPITAL_COMMUNITY)
Admission: RE | Admit: 2022-03-16 | Discharge: 2022-03-16 | Disposition: A | Discharge status: Home / Self Care | Payer: 59 | Source: Ambulatory Visit | Attending: Internal Medicine | Admitting: Internal Medicine

## 2022-03-16 ENCOUNTER — Other Ambulatory Visit: Payer: Self-pay

## 2022-03-16 VITALS — BP 143/83 | HR 90 | Temp 98.4°F | Resp 12

## 2022-03-16 DIAGNOSIS — C9110 Chronic lymphocytic leukemia of B-cell type not having achieved remission: Secondary | ICD-10-CM | POA: Insufficient documentation

## 2022-03-16 DIAGNOSIS — D509 Iron deficiency anemia, unspecified: Secondary | ICD-10-CM | POA: Diagnosis not present

## 2022-03-16 DIAGNOSIS — Z808 Family history of malignant neoplasm of other organs or systems: Secondary | ICD-10-CM

## 2022-03-16 DIAGNOSIS — K219 Gastro-esophageal reflux disease without esophagitis: Secondary | ICD-10-CM | POA: Insufficient documentation

## 2022-03-16 DIAGNOSIS — Z807 Family history of other malignant neoplasms of lymphoid, hematopoietic and related tissues: Secondary | ICD-10-CM | POA: Diagnosis not present

## 2022-03-16 DIAGNOSIS — Z803 Family history of malignant neoplasm of breast: Secondary | ICD-10-CM | POA: Insufficient documentation

## 2022-03-16 DIAGNOSIS — C180 Malignant neoplasm of cecum: Secondary | ICD-10-CM | POA: Insufficient documentation

## 2022-03-16 DIAGNOSIS — Z800 Family history of malignant neoplasm of digestive organs: Secondary | ICD-10-CM | POA: Insufficient documentation

## 2022-03-16 DIAGNOSIS — C189 Malignant neoplasm of colon, unspecified: Secondary | ICD-10-CM | POA: Insufficient documentation

## 2022-03-16 MED ORDER — IOHEXOL 9 MG/ML PO SOLN: 1000.0000 mL | Freq: Once | ORAL | Status: DC

## 2022-03-16 MED ORDER — IOHEXOL 300 MG/ML  SOLN
100.0000 mL | Freq: Once | INTRAMUSCULAR | Status: AC | PRN
  Administered 2022-03-16: 100 mL via INTRAVENOUS

## 2022-03-16 NOTE — Progress Notes (Signed)
I met with Ms Sanson after her consultation with Cira Rue, NP and Dr Burr Medico.  I explained my role as a nurse navigator and provided my contact information. I explained the services provided at [facility] and provided written information. All questions were answered.  She verbalized understanding.

## 2022-03-16 NOTE — Progress Notes (Signed)
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 Silvano Rusk  SUMMARY OF ONCOLOGY HISTORY  Oncology History  Adenocarcinoma of cecum ([place])  03/07/2022 Procedure   Colonoscopy by Dr. Carlean Purl impression - An infiltrative, polypoid and ulcerated non-obstructing large mass was found in the cecum. The mass was circumferential. This was biopsied with a cold forceps for histology.   03/07/2022 Initial Biopsy   Cecum Biopsy - INVASIVE ADENOCARCINOMA, MODERATELY DIFFERENTIATED (SEE NOTE)   03/07/2022 Tumor Marker   CEA: normal < 2   03/16/2022 Initial Diagnosis   Adenocarcinoma of cecum (HCC)      HISTORY OF PRESENTING ILLNESS:  Holly Sanders 51 y.o. female with past medical history including stage 0 chronic lymphocytic leukemia followed by Dr. Earlie Server (last seen 11/15/2021), iron deficiency anemia, and positive ANA and osteoarthritis followed by rheumatology but not on medication is here because of newly diagnosed colon cancer.  She underwent screening colonoscopy by Dr. Carlean Purl 03/07/2022 which showed an infiltrative ulcerated nonobstructing large mass in the cecum which was circumferential.  Biopsy confirmed invasive moderately differentiated adenocarcinoma. Baseline CEA normal less than 2.  Staging CT CAP is scheduled for this evening at 6 PM.  She has been referred but not scheduled for an appointment with colorectal surgery.  Socially, she lives in [place] without family nearby.  She is going through a divorce.  She has triplet children one daughter and 2 sons age 90.   She works from home for an [employer] based out of [place].  She is independent with ADLs.  She is active, plays pickle ball and tennis and walks her dog 1 mile at least 5 times per week.  She drinks wine rarely, no tobacco or other drug use.  She notes she lived 2 blocks from a [place] during childhood.  She had a negative cologuard 1.5 - 2 years ago by PCP and is up-to-date on other age-appropriate cancer screenings.  She has significant family history for cancer including her father who passed away when she was 50, had lymphoma, squamous skin cancer, and possible head and neck cancer; PGM who had stomach or colon cancer as well as breast cancer, her daughter (patient's paternal aunt) had breast cancer, and her daughter (patient's cousin) also had breast cancer.  Has sibling(s).   She presents by herself, feeling well without symptoms.  Has been on oral iron for 2 years with dark stools and occasional GERD she attributed to oral iron.  Denies obviously bloody stools, no constipation, diarrhea, nausea/vomiting.  Bowels move regularly 1-2 times per day.  Denies abdominal pain/bloating, fatigue, unintentional weight loss, or any other new specific complaints.  No B symptoms.   MEDICAL HISTORY:  Past Medical History:  Diagnosis Date   Anemia    Cancer ([place])    CLL   Leukocytosis    Lupus erythematosus    Vitamin D deficiency     SURGICAL HISTORY: Past Surgical History:  Procedure Laterality Date   CESAREAN SECTION     COLONOSCOPY  03/07/2022    SOCIAL HISTORY: Social History   Socioeconomic History   Marital status: Legally Separated  Spouse name: Not on file   Number of children: Not on file   Years of education: Not on file   Highest education level: Not on file  Occupational History   Not on file  Tobacco Use   Smoking status: Never   Smokeless tobacco: Never  Vaping Use   Vaping Use: Never used  Substance and Sexual Activity   Alcohol use: No    Comment:  rare, wine   Drug use: No   Sexual activity: Not on file  Other Topics Concern   Not on file  Social History Narrative   Not on file   Social Determinants of Health   Financial Resource Strain: Not on file  Food Insecurity: Not on file  Transportation Needs: Not on file  Physical Activity: Not on file  Stress: Not on file  Social Connections: Not on file  Intimate Partner Violence: Not on file    FAMILY HISTORY: Family History  Problem Relation Age of Onset   Heart disease Mother    Lymphoma Father    Squamous cell carcinoma Father    Basal cell carcinoma Father    Heart disease Brother    Cancer Paternal Aunt        breast   Colon polyps Paternal Grandmother    Colon cancer Paternal Grandmother    Esophageal cancer Paternal Grandfather    Cancer Cousin        breast   Rectal cancer Neg Hx    Stomach cancer Neg Hx     ALLERGIES:  has No Known Allergies.  MEDICATIONS:  Current Outpatient Medications  Medication Sig Dispense Refill   Cholecalciferol (VITAMIN D-3) 125 MCG (5000 UT) TABS Take 1 tablet by mouth daily at 2 PM.     diclofenac sodium (VOLTAREN) 1 % GEL Apply 4 g topically 4 (four) times daily. Prn     Ferrous Sulfate Dried (FEOSOL PO) Take 1 tablet by mouth daily.     No current facility-administered medications for this visit.    REVIEW OF SYSTEMS:   Constitutional: Denies fatigue, unintentional weight loss, fevers, or abnormal night sweats Eyes: Denies blurriness of vision, double vision or watery eyes Ears, nose, mouth, throat, and face: Denies mucositis or sore throat Respiratory: Denies cough, dyspnea or wheezes Cardiovascular: Denies palpitation, chest discomfort or lower extremity swelling Gastrointestinal:  Denies nausea, vomiting, constipation, diarrhea, hematochezia, or change in bowel habits (+) dark stools on oral iron (+) occasional GERD Skin: Denies abnormal skin rashes Lymphatics: Denies new lymphadenopathy or easy bruising (+) stage 0  CLL Neurological:Denies numbness, tingling or new weaknesses Behavioral/Psych: Mood is stable, no new changes  All other systems were reviewed with the patient and are negative.  PHYSICAL EXAMINATION: ECOG PERFORMANCE STATUS: 0 - Asymptomatic  Vitals:   03/16/22 1129  BP: (!) 143/83  Pulse: 90  Resp: 12  Temp: 98.4 F (36.9 C)  SpO2: 100%    GENERAL:alert, no distress and comfortable SKIN: no rash  EYES: sclera clear NECK: without  mass LYMPH:  no palpable cervical or supraclavicular lymphadenopathy LUNGS: clear to auscultation, normal breathing effort HEART: regular rate & rhythm, no lower extremity edema ABDOMEN:abdomen soft, non-tender and normal bowel sounds Musculoskeletal:no cyanosis of digits and no clubbing  PSYCH: alert & oriented x 3 with fluent speech NEURO: no focal motor/sensory deficits  LABORATORY DATA:  I have reviewed the data as listed    Latest Ref Rng & Units 03/07/2022    3:28 PM 11/15/2021    1:57 PM  08/15/2021   10:48 AM  CBC  WBC 4.0 - 10.5 K/uL 13.1  16.7  15.5   Hemoglobin 12.0 - 15.0 g/dL 11.2  11.3  10.9   Hematocrit 36.0 - 46.0 % 34.9  34.3  34.0   Platelets 150.0 - 400.0 K/uL 388.0  333  282       Latest Ref Rng & Units 03/07/2022    2:42 PM 05/16/2021   11:23 AM  CMP  Glucose 70 - 99 mg/dL 88  96   BUN 6 - 23 mg/dL 9  14   Creatinine 0.40 - 1.20 mg/dL 0.79  0.85   Sodium 135 - 145 mEq/L 139  139   Potassium 3.5 - 5.1 mEq/L 3.8  4.1   Chloride 96 - 112 mEq/L 103  104   CO2 19 - 32 mEq/L 24  29   Calcium 8.4 - 10.5 mg/dL 8.9  9.4   Total Protein 6.0 - 8.3 g/dL 7.7  7.6   Total Bilirubin 0.2 - 1.2 mg/dL 0.4  0.3   Alkaline Phos 39 - 117 U/L 71  71   AST 0 - 37 U/L 16  15   ALT 0 - 35 U/L 14  13     RADIOGRAPHIC STUDIES: I have personally reviewed the radiological images as listed and agreed with the findings in the report. No results found.  ASSESSMENT & PLAN: 51 yo female   Moderately differentiated adenocarcinoma of the  cecum -We reviewed her medical record in detail with the patient. She was found to have biopsy proved adenocarcinoma of the cecum on first colonoscopy, she is asymptomatic.  -Staging CT CAP scheduled for later today. Baseline CEA is normal.  -We will make sure she has referral to colorectal surgery. If CT scan is negative she will have surgery first.  -We reviewed the possibility she may have adenopathy on CT which could be related to her CLL; she understands a biopsy may be recommended and she is agreeable if necessary -We discussed the final colon cancer stage is determined in surgery and reviewed possible scenarios, and the role of adjuvant chemotherapy in stage II with high risk features or stage III cancer -We also discussed the role of guardant reveal to check for circulating tumor DNA, she is open if recommended -Ms. Petros is young and healthy with good performance status. -We will follow her staging work up, and if negative, will see her back after surgery to review the final path and discuss her care plan vs surveillance.  Stage 0 CLL -Diagnosed 05/2021, followed by Dr. Julien Nordmann, currently on observation -No B sx -If she has stage I colon cancer and is followed by surgery only, she can continue f/up with Dr. Julien Nordmann for her CLL. If she will stay with [place] for colon cancer treatment/surveillance then we can also manage her CLL.   IDA -Likely secondary to menorrhagia in the past (perimenopausal now with irregular lighter menses), and possibly some component of #1 and #2 above -Colonoscopy showed no other bleeding source -She has been on oral iron for 2 years, tolerating well, and received IV venofer in 06/2021 which she tolerated well -She has mild IDA on recent labs, ferritin 10, hgb 11; she is open to receiving additional IV iron prior to colon surgery. Will schedule 2 doses over the next few weeks   Family history, genetics  -Pt has CLL and newly diagnosed colon cancer, plus strong  family history of multiple cancers. She qualifies for genetics and is  interested -I referred her -MMR/MSI on cecum biopsy is pending   PLAN: -Colonoscopy, path, and labs reviewed -Staging work up CT CAP 12/7, will follow -If CT negative, proceed with colon surgery first then we will see her back to review path and discuss her plan vs surveillance  -IV venofer 400 mg x2 prior to surgery  -Pt seen with Dr. Burr Medico  -Referrals to surgery and genetics    Orders Placed This Encounter  Procedures   Ambulatory referral to Genetics    Referral Priority:   Routine    Referral Type:   Consultation    Referral Reason:   Specialty Services Required    Number of Visits Requested:   1   Ambulatory referral to Surgical Oncology    Referral Priority:   Urgent    Referral Type:   Surgical    Referral Reason:   Specialty Services Required    Requested Specialty:   Surgical Oncology    Number of Visits Requested:   1    All questions were answered. The patient knows to call the clinic with any problems, questions or concerns.     Alla Feeling, NP 03/17/2022    Addendum I have seen the patient, examined her. I agree with the assessment and and plan and have edited the notes.   51 yo female with PMN of stage 0 CLL, on surveillance, presented with screening colonoscopy discovered cecal adenocarcinoma.  She does have mild iron deficient anemia.  No other symptoms.  Staging CT scan is scheduled for later today.  I hope this is early stage colon cancer, then she would proceed with surgery first.  We discussed role of adjuvant chemotherapy in stage II or stage III colon cancer.  We discussed colon cancer surveillance after curative intent treatment. We will check MMR/MSI to rule out Lynch syndrome. She has multiple history of malignancy, will refer her to genetics.  She is interested.  All questions were answered.  Will make sure she has appointment with colorectal surgeon.  We will see her back after  surgery if needed.  Truitt Merle MD 03/16/2022

## 2022-03-16 NOTE — Progress Notes (Signed)
I emailed Dr Donneta Romberg at [facility] requesting MMR and MSI testing on Holly Sanders's cecal biopsy tissue.

## 2022-03-17 ENCOUNTER — Telehealth: Payer: Self-pay | Admitting: Internal Medicine

## 2022-03-17 ENCOUNTER — Encounter: Payer: Self-pay | Admitting: Nurse Practitioner

## 2022-03-17 NOTE — Telephone Encounter (Signed)
Called patient to schedule next appointment. Left voicemail with new appointment information

## 2022-03-21 ENCOUNTER — Ambulatory Visit: Payer: Self-pay | Admitting: General Surgery

## 2022-03-21 NOTE — H&P (Signed)
REFERRING PHYSICIAN:  Gatha Mayer, MD   PROVIDER:  Monico Blitz, MD   MRN: [id number] DOB: 11-04-1970 DATE OF ENCOUNTER: 03/21/2022   Subjective    Chief Complaint: New Consultation (NEW CANCER - Adenocarcinoma of the colon, )       History of Present Illness: Holly Sanders is a 51 y.o. female who is seen today as an office consultation at the request of Dr. Carlean Purl for evaluation of New Consultation (NEW CANCER - Adenocarcinoma of the colon, ) .  Patient underwent a screening colonoscopy on 03/07/2022.  An infiltrative polypoid mass was found in the cecum.  This was biopsied and tattooed.  Pathology showed invasive adenocarcinoma, moderately differentiated.  CEA was less than 5.  CT scan of the chest abdomen and pelvis shows the lesion in the right colon that appears nonobstructive and some adjacent lymph nodes but no evidence for distant metastatic disease in the chest abdomen and pelvis.  Patient's only surgical history is C-section.  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   Review of Systems: A complete review of systems was obtained from the patient.  I have reviewed this information and discussed as appropriate with the patient.  See HPI as well for other ROS.     Medical History: Past Medical History      Past Medical History:  Diagnosis Date   Anemia     History of cancer          There is no problem list on file for this patient.     Past Surgical History       Past Surgical History:  Procedure Laterality Date   REPEAT CESAREAN SECTION            Allergies  No Known Allergies           Current Outpatient Medications on File Prior to Visit  Medication Sig Dispense Refill   cholecalciferol, vitamin D3, (VITAMIN D3) 125 mcg (5,000 unit) tablet Take by mouth       diclofenac (VOLTAREN) 1 % topical gel Apply topically       sodium, potassium, and magnesium (SUPREP) oral solution USE 1 KIT BY MOUTH FOR 1 DOSE.         No current facility-administered medications on file prior to visit.      Family History       Family History  Problem Relation Age of Onset   High blood pressure (Hypertension) Mother     Coronary Artery Disease (Blocked arteries around heart) Mother     Hyperlipidemia (Elevated cholesterol) Father     Hyperlipidemia (Elevated cholesterol) Brother     Coronary Artery Disease (Blocked arteries around heart) Brother          Social History       Tobacco Use  Smoking Status Never  Smokeless Tobacco Never      Social History  Social History        Socioeconomic History   Marital status: Legally Separated  Tobacco Use   Smoking status: Never   Smokeless tobacco: Never  Substance and Sexual Activity   Alcohol use: Yes   Drug use: Never        Objective:         Vitals:    03/21/22 0949  BP: 114/70  Pulse: 87  Temp: 36.7 C (98 F)  SpO2: 97%  Weight: 88.5 kg (195 lb)  Height: 170.2 cm (_0 )  Exam Gen: NAD Abd: pubic incisional scar     Labs, Imaging and Diagnostic Testing: Cbc, CT scans, colonoscopy report and recent oncology notes reviewed   Assessment and Plan:  Diagnoses and all orders for this visit:   Colon cancer, ascending (CMS-HCC)     51 year-old female who presents to the office for evaluation of a newly diagnosed ascending colon cancer.  Patient has a stage 0 history of CLL as well.  Surgical history is significant for C-section only.  Metastatic workup has been negative.  CEA is normal.  I have recommended proceeding with minimally invasive partial colectomy.  Risk and benefits of surgery were discussed in detail.  All questions were answered. The surgery and anatomy were described to the patient as well as the risks of surgery and the possible complications.  These include: Bleeding, deep abdominal infections and possible wound complications such as hernia and infection, damage to adjacent structures, leak of surgical connections,  which can lead to other surgeries and possibly an ostomy, possible need for other procedures, such as abscess drains in radiology, possible prolonged hospital stay, possible diarrhea from removal of part of the colon, possible constipation from narcotics, prolonged fatigue/weakness or appetite loss, possible early recurrence of of disease, possible complications of their medical problems such as heart disease or arrhythmias or lung problems, death (less than 1%). I believe the patient understands and wishes to proceed with the surgery.    Entire procedure encounter took 63 minutes including reviewing pertinent information, counseling patient and providing appropriate treatment set up.   No follow-ups on file.    Rosario Adie, MD Colon and Rectal Surgery [facility]

## 2022-03-23 ENCOUNTER — Other Ambulatory Visit: Payer: Self-pay

## 2022-03-23 ENCOUNTER — Telehealth: Payer: Self-pay

## 2022-03-23 NOTE — Telephone Encounter (Signed)
Please see note below and advise if any other recommendations :

## 2022-03-23 NOTE — Telephone Encounter (Signed)
Holly Sanders called wanting to speak with Dr. Marla Roe' nurse but was unable to reach anyone.Tawanna in pathology in the basement states Pt had procedure done by Dr. Carlean Purl 11/28. Specimen was sent out for special studies but they have to be done on normal tissue and the specimen they were sent has cancer present. They need normal tissue to do the special test and request the pt have a purple top lab drawn labeled MSI and sent to [facility] Pathology. If you have questions call and speak with Tawanna at [phone]. The pathologist is Dr. Donneta Romberg.

## 2022-03-23 NOTE — Telephone Encounter (Signed)
I think they are asking for a tube of blood - I will message her oncologist to see if they can draw it when she has appt  I am not sure how to do this

## 2022-03-23 NOTE — Telephone Encounter (Signed)
Spoke with Laurence Aly in path lab. Laurence Aly stated that she received a call from [facility] Pathology stated that   Pt had procedure done by Dr. Carlean Purl 11/28. Specimen was sent out for special studies but they have to be done on normal tissue and the specimen they were sent has cancer present. They need normal tissue to do the special test and request the pt have a purple top lab drawn labeled MSI and sent to [facility] Pathology. . The pathologist is Dr. Donneta Romberg.   Received number to speak with Skeet Simmer tomorrow at [facility] pathology to get a better understanding of what she needs exactly and how to place order:

## 2022-03-24 ENCOUNTER — Other Ambulatory Visit: Payer: Self-pay

## 2022-03-24 DIAGNOSIS — C189 Malignant neoplasm of colon, unspecified: Secondary | ICD-10-CM

## 2022-03-24 NOTE — Telephone Encounter (Signed)
I involved oncology and they are taking care of it as per the thread below.  We do not need to do anything further.     RE: [facility] testing Received: Today [place], Deliah Goody, RN  Truitt Merle, MD; Gatha Mayer, MD MMR was completed on her cecal biopsy and is normal.  I messaged pathology regarding MSI testing.  KB       Previous Messages    ----- Message ----- From: Truitt Merle, MD Sent: 03/23/2022   6:57 PM EST To: Gatha Mayer, MD; Royston Bake, RN Subject: RE: [facility] testing                                No problem.  Santiago Glad, could you call path lab and let them know we only need MMR on her biopsy, and we can do MSI on her surgical sample in future.  Thanks  Krista Blue ----- Message ----- From: Gatha Mayer, MD Sent: 03/23/2022   5:13 PM EST To: Truitt Merle, MD Subject: [facility] testing                                    Krista Blue,  Pathology is asking me for normal tissue to do her MSH/MSI testing - purple top.  Can yo set this up in the cancer center - I am not sure how to do this.  Thanks  Musician

## 2022-03-24 NOTE — Telephone Encounter (Signed)
Noted:  Tawanna in Path lab made aware:

## 2022-03-24 NOTE — Telephone Encounter (Signed)
I spoke with Rosann Auerbach with [facility]ology. Rosann Auerbach stated that Dr. Donneta Romberg the pathologist who is working on this stated that in regard to the MSI testing the normal tissue that they have has cancer in it and is requesting that a purple top tube be drawn from our lab downstairs to continue with the MSI testing: Tawanna in the Path Lab downstairs stated that a CBC is a purple top tube, the pt can have the blood drawn and placed in the purple top and then Laurence Aly will ensure that it going to the correct place to continue with the process of the MSI testing: Please advise if you want to continue with this plan:

## 2022-03-31 ENCOUNTER — Other Ambulatory Visit: Payer: Self-pay

## 2022-03-31 ENCOUNTER — Inpatient Hospital Stay: Payer: 59

## 2022-03-31 VITALS — BP 135/67 | HR 73 | Temp 97.9°F | Resp 16

## 2022-03-31 DIAGNOSIS — D500 Iron deficiency anemia secondary to blood loss (chronic): Secondary | ICD-10-CM

## 2022-03-31 DIAGNOSIS — C180 Malignant neoplasm of cecum: Secondary | ICD-10-CM | POA: Diagnosis not present

## 2022-03-31 MED ORDER — SODIUM CHLORIDE 0.9 % IV SOLN: Freq: Once | INTRAVENOUS | Status: AC

## 2022-03-31 MED ORDER — SODIUM CHLORIDE 0.9 % IV SOLN
400.0000 mg | Freq: Once | INTRAVENOUS | Status: AC
  Administered 2022-03-31: 400 mg via INTRAVENOUS
  Filled 2022-03-31: qty 20

## 2022-03-31 NOTE — Patient Instructions (Signed)

## 2022-03-31 NOTE — Progress Notes (Signed)
Patient tolerated IV iron infusion well, without complication. Observed for 30 minutes after infusion complete with no issues.

## 2022-04-13 NOTE — Patient Instructions (Signed)
DUE TO COVID-19 ONLY TWO VISITORS  (aged 54 and older)  ARE ALLOWED TO COME WITH YOU AND STAY IN THE WAITING ROOM ONLY DURING PRE OP AND PROCEDURE.   **NO VISITORS ARE ALLOWED IN THE SHORT STAY AREA OR RECOVERY ROOM!!**  IF YOU WILL BE ADMITTED INTO THE HOSPITAL YOU ARE ALLOWED ONLY FOUR SUPPORT PEOPLE DURING VISITATION HOURS ONLY (7 AM -8PM)   The support person(s) must pass our screening, gel in and out, and wear a mask at all times, including in the patient's room. Patients must also wear a mask when staff or their support person are in the room. Visitors GUEST BADGE MUST BE WORN VISIBLY  One adult visitor may remain with you overnight and MUST be in the room by 8 P.M.     Your procedure is scheduled on: 04/21/22   Report to [facility] Main Entrance    Report to admitting at : 11:45 AM   Call this number if you have problems the morning of surgery [phone]   Do not eat food :After Midnight.   After Midnight you may have the following liquids until : 11:00 AM DAY OF SURGERY  Water Black Coffee (sugar ok, NO MILK/CREAM OR CREAMERS)  Tea (sugar ok, NO MILK/CREAM OR CREAMERS) regular and decaf                             Plain Jell-O (NO RED)                                           Fruit ices (not with fruit pulp, NO RED)                                     Popsicles (NO RED)                                                                  Juice: apple, WHITE grape, WHITE cranberry Sports drinks like Gatorade (NO RED)              Drink 2 Ensure/G2 drinks AT 10:00 PM the night before surgery.     The day of surgery:  Drink ONE (1) Pre-Surgery Clear Ensure or G2 at : 11:00 AM the morning of surgery. Drink in one sitting. Do not sip.  This drink was given to you during your hospital  pre-op appointment visit. Nothing else to drink after completing the  Pre-Surgery Clear Ensure or G2.          If you have questions, please contact your surgeon's office.  FOLLOW  BOWEL PREP AND ANY ADDITIONAL PRE OP INSTRUCTIONS YOU RECEIVED FROM YOUR SURGEON'S OFFICE!!!   Oral Hygiene is also important to reduce your risk of infection.                                    Remember - BRUSH YOUR TEETH THE MORNING OF SURGERY WITH YOUR REGULAR TOOTHPASTE  DENTURES WILL BE REMOVED PRIOR TO SURGERY PLEASE DO NOT APPLY "Poly grip" OR ADHESIVES!!!   Do NOT smoke after Midnight   Take these medicines the morning of surgery with A SIP OF WATER: keflex.  DO NOT TAKE ANY ORAL DIABETIC MEDICATIONS DAY OF YOUR SURGERY  Bring CPAP mask and tubing day of surgery.                              You may not have any metal on your body including hair pins, jewelry, and body piercing             Do not wear make-up, lotions, powders, perfumes/cologne, or deodorant  Do not wear nail polish including gel and S&S, artificial/acrylic nails, or any other type of covering on natural nails including finger and toenails. If you have artificial nails, gel coating, etc. that needs to be removed by a nail salon please have this removed prior to surgery or surgery may need to be canceled/ delayed if the surgeon/ anesthesia feels like they are unable to be safely monitored.   Do not shave  48 hours prior to surgery.    Do not bring valuables to the hospital. [place].   Contacts, glasses, or bridgework may not be worn into surgery.   Bring small overnight bag day of surgery.   DO NOT [place]. PHARMACY WILL DISPENSE MEDICATIONS LISTED ON YOUR MEDICATION LIST TO YOU DURING YOUR ADMISSION [place]!    Patients discharged on the day of surgery will not be allowed to drive home.  Someone NEEDS to stay with you for the first 24 hours after anesthesia.   Special Instructions: Bring a copy of your healthcare power of attorney and living will documents         the day of surgery if you haven't scanned them  before.              Please read over the following fact sheets you were given: IF YOU HAVE QUESTIONS ABOUT YOUR PRE-OP INSTRUCTIONS PLEASE CALL [phone]    [facility] - Preparing for Surgery Before surgery, you can play an important role.  Because skin is not sterile, your skin needs to be as free of germs as possible.  You can reduce the number of germs on your skin by washing with CHG (chlorahexidine gluconate) soap before surgery.  CHG is an antiseptic cleaner which kills germs and bonds with the skin to continue killing germs even after washing. Please DO NOT use if you have an allergy to CHG or antibacterial soaps.  If your skin becomes reddened/irritated stop using the CHG and inform your nurse when you arrive at Short Stay. Do not shave (including legs and underarms) for at least 48 hours prior to the first CHG shower.  You may shave your face/neck. Please follow these instructions carefully:  1.  Shower with CHG Soap the night before surgery and the  morning of Surgery.  2.  If you choose to wash your hair, wash your hair first as usual with your  normal  shampoo.  3.  After you shampoo, rinse your hair and body thoroughly to remove the  shampoo.  4.  Use CHG as you would any other liquid soap.  You can apply chg directly  to the skin and wash                       Gently with a scrungie or clean washcloth.  5.  Apply the CHG Soap to your body ONLY FROM THE NECK DOWN.   Do not use on face/ open                           Wound or open sores. Avoid contact with eyes, ears mouth and genitals (private parts).                       Wash face,  Genitals (private parts) with your normal soap.             6.  Wash thoroughly, paying special attention to the area where your surgery  will be performed.  7.  Thoroughly rinse your body with warm water from the neck down.  8.  DO NOT shower/wash with your normal soap after using and rinsing off  the CHG Soap.                 9.  Pat yourself dry with a clean towel.            10.  Wear clean pajamas.            11.  Place clean sheets on your bed the night of your first shower and do not  sleep with pets. Day of Surgery : Do not apply any lotions/deodorants the morning of surgery.  Please wear clean clothes to the hospital/surgery center.  FAILURE TO FOLLOW THESE INSTRUCTIONS MAY RESULT IN THE CANCELLATION OF YOUR SURGERY PATIENT SIGNATURE_________________________________  NURSE SIGNATURE__________________________________  ________________________________________________________________________  Holly Sanders  An incentive spirometer is a tool that can help keep your lungs clear and active. This tool measures how well you are filling your lungs with each breath. Taking long deep breaths may help reverse or decrease the chance of developing breathing (pulmonary) problems (especially infection) following: A long period of time when you are unable to move or be active. BEFORE THE PROCEDURE  If the spirometer includes an indicator to show your best effort, your nurse or respiratory therapist will set it to a desired goal. If possible, sit up straight or lean slightly forward. Try not to slouch. Hold the incentive spirometer in an upright position. INSTRUCTIONS FOR USE  Sit on the edge of your bed if possible, or sit up as far as you can in bed or on a chair. Hold the incentive spirometer in an upright position. Breathe out normally. Place the mouthpiece in your mouth and seal your lips tightly around it. Breathe in slowly and as deeply as possible, raising the piston or the ball toward the top of the column. Hold your breath for 3-5 seconds or for as long as possible. Allow the piston or ball to fall to the bottom of the column. Remove the mouthpiece from your mouth and breathe out normally. Rest for a few seconds and repeat Steps 1 through 7 at least 10 times every 1-2 hours when you are awake. Take  your time and take a few normal breaths between deep breaths. The spirometer may include an indicator to show your best effort. Use the indicator as a goal to work toward during  each repetition. After each set of 10 deep breaths, practice coughing to be sure your lungs are clear. If you have an incision (the cut made at the time of surgery), support your incision when coughing by placing a pillow or rolled up towels firmly against it. Once you are able to get out of bed, walk around indoors and cough well. You may stop using the incentive spirometer when instructed by your caregiver.  RISKS AND COMPLICATIONS Take your time so you do not get dizzy or light-headed. If you are in pain, you may need to take or ask for pain medication before doing incentive spirometry. It is harder to take a deep breath if you are having pain. AFTER USE Rest and breathe slowly and easily. It can be helpful to keep track of a log of your progress. Your caregiver can provide you with a simple table to help with this. If you are using the spirometer at home, follow these instructions: [place] IF:  You are having difficultly using the spirometer. You have trouble using the spirometer as often as instructed. Your pain medication is not giving enough relief while using the spirometer. You develop fever of 100.5 F (38.1 C) or higher. SEEK IMMEDIATE MEDICAL CARE IF:  You cough up bloody sputum that had not been present before. You develop fever of 102 F (38.9 C) or greater. You develop worsening pain at or near the incision site. MAKE SURE YOU:  Understand these instructions. Will watch your condition. Will get help right away if you are not doing well or get worse. Document Released: 08/07/2006 Document Revised: 06/19/2011 Document Reviewed: 10/08/2006 [facility] Patient Information 2014 [place], [place].   ________________________________________________________________________

## 2022-04-14 ENCOUNTER — Inpatient Hospital Stay: Payer: 59 | Attending: Nurse Practitioner

## 2022-04-14 ENCOUNTER — Other Ambulatory Visit: Payer: Self-pay

## 2022-04-14 ENCOUNTER — Encounter (HOSPITAL_COMMUNITY): Payer: Self-pay

## 2022-04-14 ENCOUNTER — Encounter (HOSPITAL_COMMUNITY)
Admission: RE | Admit: 2022-04-14 | Discharge: 2022-04-14 | Disposition: A | Discharge status: Home / Self Care | Payer: 59 | Source: Ambulatory Visit | Attending: General Surgery | Admitting: General Surgery

## 2022-04-14 VITALS — BP 140/80 | HR 82 | Temp 98.2°F | Ht 67.0 in | Wt 195.0 lb

## 2022-04-14 VITALS — BP 132/78 | HR 73 | Temp 98.1°F | Resp 18

## 2022-04-14 DIAGNOSIS — Z01818 Encounter for other preprocedural examination: Secondary | ICD-10-CM

## 2022-04-14 DIAGNOSIS — D509 Iron deficiency anemia, unspecified: Secondary | ICD-10-CM | POA: Diagnosis present

## 2022-04-14 DIAGNOSIS — D500 Iron deficiency anemia secondary to blood loss (chronic): Secondary | ICD-10-CM

## 2022-04-14 DIAGNOSIS — Z01812 Encounter for preprocedural laboratory examination: Secondary | ICD-10-CM | POA: Insufficient documentation

## 2022-04-14 LAB — CBC
HCT: 36.1 % (ref 36.0–46.0)
Hemoglobin: 11.2 g/dL — ABNORMAL LOW (ref 12.0–15.0)
MCH: 27.8 pg (ref 26.0–34.0)
MCHC: 31 g/dL (ref 30.0–36.0)
MCV: 89.6 fL (ref 80.0–100.0)
Platelets: 352 10*3/uL (ref 150–400)
RBC: 4.03 MIL/uL (ref 3.87–5.11)
RDW: 14.9 % (ref 11.5–15.5)
WBC: 12.9 10*3/uL — ABNORMAL HIGH (ref 4.0–10.5)
nRBC: 0 % (ref 0.0–0.2)

## 2022-04-14 MED ORDER — SODIUM CHLORIDE 0.9 % IV SOLN: Freq: Once | INTRAVENOUS | Status: AC

## 2022-04-14 MED ORDER — SODIUM CHLORIDE 0.9 % IV SOLN
400.0000 mg | Freq: Once | INTRAVENOUS | Status: AC
  Administered 2022-04-14: 400 mg via INTRAVENOUS
  Filled 2022-04-14: qty 20

## 2022-04-14 NOTE — Patient Instructions (Signed)

## 2022-04-14 NOTE — Progress Notes (Addendum)
For Short Stay: [place] appointment date:  Bowel Prep reminder:   For Anesthesia: PCP - Dr. Rachell Cipro. Cardiologist -   Chest x-ray - CT chest: 03/16/22 EKG -  Stress Test -  ECHO -  Cardiac Cath -  Pacemaker/ICD device last checked: Pacemaker orders received: Device Rep notified:  Spinal Cord Stimulator:  Sleep Study -  CPAP -   Fasting Blood Sugar -  Checks Blood Sugar _____ times a day Date and result of last Hgb A1c-  Last dose of GLP1 agonist-  GLP1 instructions:   Last dose of SGLT-2 inhibitors-  SGLT-2 instructions:   Blood Thinner Instructions: Aspirin Instructions: Last Dose:  Activity level: Can go up a flight of stairs and activities of daily living without stopping and without chest pain and/or shortness of breath   Able to exercise without chest pain and/or shortness of breath  Anesthesia review:   Patient denies shortness of breath, fever, cough and chest pain at PAT appointment   Patient verbalized understanding of instructions that were given to them at the PAT appointment. Patient was also instructed that they will need to review over the PAT instructions again at home before surgery.

## 2022-04-21 ENCOUNTER — Other Ambulatory Visit: Payer: Self-pay

## 2022-04-21 ENCOUNTER — Inpatient Hospital Stay (HOSPITAL_COMMUNITY): Payer: 59 | Admitting: Anesthesiology

## 2022-04-21 ENCOUNTER — Encounter (HOSPITAL_COMMUNITY): Payer: Self-pay | Admitting: General Surgery

## 2022-04-21 ENCOUNTER — Inpatient Hospital Stay (HOSPITAL_COMMUNITY)
Admission: RE | Admit: 2022-04-21 | Discharge: 2022-04-23 | DRG: 330 | Disposition: A | Discharge status: Home / Self Care | Payer: 59 | Attending: General Surgery | Admitting: General Surgery

## 2022-04-21 ENCOUNTER — Encounter (HOSPITAL_COMMUNITY)
Admission: RE | Disposition: A | Discharge status: Home / Self Care | Payer: Self-pay | Source: Home / Self Care | Attending: General Surgery

## 2022-04-21 DIAGNOSIS — Z800 Family history of malignant neoplasm of digestive organs: Secondary | ICD-10-CM | POA: Diagnosis not present

## 2022-04-21 DIAGNOSIS — D630 Anemia in neoplastic disease: Secondary | ICD-10-CM

## 2022-04-21 DIAGNOSIS — Z01818 Encounter for other preprocedural examination: Secondary | ICD-10-CM

## 2022-04-21 DIAGNOSIS — Z807 Family history of other malignant neoplasms of lymphoid, hematopoietic and related tissues: Secondary | ICD-10-CM | POA: Diagnosis not present

## 2022-04-21 DIAGNOSIS — C180 Malignant neoplasm of cecum: Secondary | ICD-10-CM | POA: Diagnosis present

## 2022-04-21 DIAGNOSIS — Z8249 Family history of ischemic heart disease and other diseases of the circulatory system: Secondary | ICD-10-CM

## 2022-04-21 DIAGNOSIS — L930 Discoid lupus erythematosus: Secondary | ICD-10-CM | POA: Diagnosis present

## 2022-04-21 DIAGNOSIS — Z83719 Family history of colon polyps, unspecified: Secondary | ICD-10-CM | POA: Diagnosis not present

## 2022-04-21 DIAGNOSIS — Z856 Personal history of leukemia: Secondary | ICD-10-CM | POA: Diagnosis not present

## 2022-04-21 DIAGNOSIS — E559 Vitamin D deficiency, unspecified: Secondary | ICD-10-CM | POA: Diagnosis present

## 2022-04-21 DIAGNOSIS — C189 Malignant neoplasm of colon, unspecified: Secondary | ICD-10-CM

## 2022-04-21 DIAGNOSIS — Z635 Disruption of family by separation and divorce: Secondary | ICD-10-CM | POA: Diagnosis not present

## 2022-04-21 DIAGNOSIS — C182 Malignant neoplasm of ascending colon: Secondary | ICD-10-CM | POA: Diagnosis present

## 2022-04-21 DIAGNOSIS — Z808 Family history of malignant neoplasm of other organs or systems: Secondary | ICD-10-CM | POA: Diagnosis not present

## 2022-04-21 DIAGNOSIS — K660 Peritoneal adhesions (postprocedural) (postinfection): Secondary | ICD-10-CM | POA: Diagnosis present

## 2022-04-21 LAB — TYPE AND SCREEN
ABO/RH(D): A POS
Antibody Screen: NEGATIVE

## 2022-04-21 LAB — ABO/RH: ABO/RH(D): A POS

## 2022-04-21 LAB — POCT PREGNANCY, URINE: Preg Test, Ur: NEGATIVE

## 2022-04-21 SURGERY — COLECTOMY, PARTIAL, ROBOT-ASSISTED, LAPAROSCOPIC
Anesthesia: General

## 2022-04-21 MED ORDER — DEXAMETHASONE SODIUM PHOSPHATE 10 MG/ML IJ SOLN
INTRAMUSCULAR | Status: DC | PRN
  Administered 2022-04-21: 10 mg via INTRAVENOUS

## 2022-04-21 MED ORDER — HEPARIN SODIUM (PORCINE) 5000 UNIT/ML IJ SOLN
5000.0000 [IU] | Freq: Once | INTRAMUSCULAR | Status: AC
  Administered 2022-04-21: 5000 [IU] via SUBCUTANEOUS
  Filled 2022-04-21: qty 1

## 2022-04-21 MED ORDER — LIP MEDEX EX OINT
TOPICAL_OINTMENT | CUTANEOUS | Status: AC
  Filled 2022-04-21: qty 7

## 2022-04-21 MED ORDER — ONDANSETRON HCL 4 MG/2ML IJ SOLN: 4.0000 mg | Freq: Four times a day (QID) | INTRAMUSCULAR | Status: DC | PRN

## 2022-04-21 MED ORDER — MIDAZOLAM HCL 2 MG/2ML IJ SOLN
INTRAMUSCULAR | Status: DC | PRN
  Administered 2022-04-21: 2 mg via INTRAVENOUS

## 2022-04-21 MED ORDER — FENTANYL CITRATE (PF) 250 MCG/5ML IJ SOLN
INTRAMUSCULAR | Status: DC | PRN
  Administered 2022-04-21 (×5): 50 ug via INTRAVENOUS

## 2022-04-21 MED ORDER — AMISULPRIDE (ANTIEMETIC) 5 MG/2ML IV SOLN: 10.0000 mg | Freq: Once | INTRAVENOUS | Status: DC | PRN

## 2022-04-21 MED ORDER — 0.9 % SODIUM CHLORIDE (POUR BTL) OPTIME
TOPICAL | Status: DC | PRN
  Administered 2022-04-21: 1000 mL

## 2022-04-21 MED ORDER — BUPIVACAINE-EPINEPHRINE (PF) 0.5% -1:200000 IJ SOLN
INTRAMUSCULAR | Status: AC
  Filled 2022-04-21: qty 30

## 2022-04-21 MED ORDER — OXYCODONE HCL 5 MG/5ML PO SOLN: 5.0000 mg | Freq: Once | ORAL | Status: DC | PRN

## 2022-04-21 MED ORDER — CHLORHEXIDINE GLUCONATE 0.12 % MT SOLN
15.0000 mL | Freq: Once | OROMUCOSAL | Status: AC
  Administered 2022-04-21: 15 mL via OROMUCOSAL

## 2022-04-21 MED ORDER — ONDANSETRON HCL 4 MG/2ML IJ SOLN: 4.0000 mg | Freq: Once | INTRAMUSCULAR | Status: DC | PRN

## 2022-04-21 MED ORDER — FENTANYL CITRATE (PF) 250 MCG/5ML IJ SOLN
INTRAMUSCULAR | Status: AC
  Filled 2022-04-21: qty 5

## 2022-04-21 MED ORDER — PHENYLEPHRINE 80 MCG/ML (10ML) SYRINGE FOR IV PUSH (FOR BLOOD PRESSURE SUPPORT)
PREFILLED_SYRINGE | INTRAVENOUS | Status: DC | PRN
  Administered 2022-04-21 (×3): 160 ug via INTRAVENOUS

## 2022-04-21 MED ORDER — ENSURE PRE-SURGERY PO LIQD: 296.0000 mL | Freq: Once | ORAL | Status: DC

## 2022-04-21 MED ORDER — HYDROMORPHONE HCL 1 MG/ML IJ SOLN
0.5000 mg | INTRAMUSCULAR | Status: DC | PRN
  Administered 2022-04-21 – 2022-04-22 (×3): 0.5 mg via INTRAVENOUS
  Filled 2022-04-21 (×3): qty 0.5

## 2022-04-21 MED ORDER — LIDOCAINE 2% (20 MG/ML) 5 ML SYRINGE
INTRAMUSCULAR | Status: DC | PRN
  Administered 2022-04-21: 1.5 mg/kg/h via INTRAVENOUS
  Administered 2022-04-21: 60 mg via INTRAVENOUS

## 2022-04-21 MED ORDER — SUGAMMADEX SODIUM 200 MG/2ML IV SOLN
INTRAVENOUS | Status: DC | PRN
  Administered 2022-04-21: 200 mg via INTRAVENOUS

## 2022-04-21 MED ORDER — LIDOCAINE HCL (PF) 2 % IJ SOLN
INTRAMUSCULAR | Status: AC
  Filled 2022-04-21: qty 5

## 2022-04-21 MED ORDER — ALBUMIN HUMAN 5 % IV SOLN: INTRAVENOUS | Status: DC | PRN

## 2022-04-21 MED ORDER — ONDANSETRON HCL 4 MG/2ML IJ SOLN
INTRAMUSCULAR | Status: DC | PRN
  Administered 2022-04-21: 4 mg via INTRAVENOUS

## 2022-04-21 MED ORDER — BUPIVACAINE LIPOSOME 1.3 % IJ SUSP: 20.0000 mL | Freq: Once | INTRAMUSCULAR | Status: DC

## 2022-04-21 MED ORDER — ORAL CARE MOUTH RINSE: 15.0000 mL | Freq: Once | OROMUCOSAL | Status: AC

## 2022-04-21 MED ORDER — ACETAMINOPHEN 500 MG PO TABS: 1000.0000 mg | ORAL_TABLET | Freq: Once | ORAL | Status: DC

## 2022-04-21 MED ORDER — KETAMINE HCL 50 MG/5ML IJ SOSY
PREFILLED_SYRINGE | INTRAMUSCULAR | Status: AC
  Filled 2022-04-21: qty 5

## 2022-04-21 MED ORDER — ONDANSETRON HCL 4 MG/2ML IJ SOLN
INTRAMUSCULAR | Status: AC
  Filled 2022-04-21: qty 2

## 2022-04-21 MED ORDER — ALBUMIN HUMAN 5 % IV SOLN
INTRAVENOUS | Status: AC
  Filled 2022-04-21: qty 250

## 2022-04-21 MED ORDER — OXYCODONE HCL 5 MG PO TABS: 5.0000 mg | ORAL_TABLET | Freq: Once | ORAL | Status: DC | PRN

## 2022-04-21 MED ORDER — ENSURE PRE-SURGERY PO LIQD: 592.0000 mL | Freq: Once | ORAL | Status: DC

## 2022-04-21 MED ORDER — SCOPOLAMINE 1 MG/3DAYS TD PT72
1.0000 | MEDICATED_PATCH | TRANSDERMAL | Status: DC
  Administered 2022-04-21: 1.5 mg via TRANSDERMAL
  Filled 2022-04-21: qty 1

## 2022-04-21 MED ORDER — PROPOFOL 10 MG/ML IV BOLUS
INTRAVENOUS | Status: DC | PRN
  Administered 2022-04-21: 180 mg via INTRAVENOUS

## 2022-04-21 MED ORDER — ENOXAPARIN SODIUM 40 MG/0.4ML IJ SOSY
40.0000 mg | PREFILLED_SYRINGE | INTRAMUSCULAR | Status: DC
  Administered 2022-04-22 – 2022-04-23 (×2): 40 mg via SUBCUTANEOUS
  Filled 2022-04-21 (×2): qty 0.4

## 2022-04-21 MED ORDER — GABAPENTIN 300 MG PO CAPS
300.0000 mg | ORAL_CAPSULE | ORAL | Status: AC
  Administered 2022-04-21: 300 mg via ORAL
  Filled 2022-04-21: qty 1

## 2022-04-21 MED ORDER — ALVIMOPAN 12 MG PO CAPS
12.0000 mg | ORAL_CAPSULE | Freq: Two times a day (BID) | ORAL | Status: DC
  Administered 2022-04-22 – 2022-04-23 (×3): 12 mg via ORAL
  Filled 2022-04-21 (×3): qty 1

## 2022-04-21 MED ORDER — DIPHENHYDRAMINE HCL 12.5 MG/5ML PO ELIX: 12.5000 mg | ORAL_SOLUTION | Freq: Four times a day (QID) | ORAL | Status: DC | PRN

## 2022-04-21 MED ORDER — LACTATED RINGERS IV SOLN: INTRAVENOUS | Status: DC | PRN

## 2022-04-21 MED ORDER — SODIUM CHLORIDE 0.9 % IV SOLN
2.0000 g | INTRAVENOUS | Status: AC
  Administered 2022-04-21: 2 g via INTRAVENOUS
  Filled 2022-04-21: qty 2

## 2022-04-21 MED ORDER — ALVIMOPAN 12 MG PO CAPS
12.0000 mg | ORAL_CAPSULE | ORAL | Status: AC
  Administered 2022-04-21: 12 mg via ORAL
  Filled 2022-04-21: qty 1

## 2022-04-21 MED ORDER — ENSURE SURGERY PO LIQD
237.0000 mL | Freq: Two times a day (BID) | ORAL | Status: DC
  Administered 2022-04-22 – 2022-04-23 (×2): 237 mL via ORAL

## 2022-04-21 MED ORDER — POLYETHYLENE GLYCOL 3350 17 GM/SCOOP PO POWD: 1.0000 | Freq: Once | ORAL | Status: DC

## 2022-04-21 MED ORDER — ROCURONIUM BROMIDE 10 MG/ML (PF) SYRINGE
PREFILLED_SYRINGE | INTRAVENOUS | Status: DC | PRN
  Administered 2022-04-21 (×2): 20 mg via INTRAVENOUS
  Administered 2022-04-21: 60 mg via INTRAVENOUS

## 2022-04-21 MED ORDER — SIMETHICONE 80 MG PO CHEW
40.0000 mg | CHEWABLE_TABLET | Freq: Four times a day (QID) | ORAL | Status: DC | PRN
  Administered 2022-04-21 – 2022-04-23 (×2): 40 mg via ORAL
  Filled 2022-04-21 (×3): qty 1

## 2022-04-21 MED ORDER — DIPHENHYDRAMINE HCL 50 MG/ML IJ SOLN: 12.5000 mg | Freq: Four times a day (QID) | INTRAMUSCULAR | Status: DC | PRN

## 2022-04-21 MED ORDER — LIDOCAINE HCL (PF) 2 % IJ SOLN
INTRAMUSCULAR | Status: AC
  Filled 2022-04-21: qty 20

## 2022-04-21 MED ORDER — PROPOFOL 10 MG/ML IV BOLUS
INTRAVENOUS | Status: AC
  Filled 2022-04-21: qty 20

## 2022-04-21 MED ORDER — ROCURONIUM BROMIDE 10 MG/ML (PF) SYRINGE
PREFILLED_SYRINGE | INTRAVENOUS | Status: AC
  Filled 2022-04-21: qty 10

## 2022-04-21 MED ORDER — ONDANSETRON HCL 4 MG PO TABS: 4.0000 mg | ORAL_TABLET | Freq: Four times a day (QID) | ORAL | Status: DC | PRN

## 2022-04-21 MED ORDER — KETAMINE HCL 10 MG/ML IJ SOLN
INTRAMUSCULAR | Status: DC | PRN
  Administered 2022-04-21: 30 mg via INTRAVENOUS
  Administered 2022-04-21: 10 mg via INTRAVENOUS

## 2022-04-21 MED ORDER — BUPIVACAINE LIPOSOME 1.3 % IJ SUSP
INTRAMUSCULAR | Status: DC | PRN
  Administered 2022-04-21: 20 mL

## 2022-04-21 MED ORDER — KCL IN DEXTROSE-NACL 20-5-0.45 MEQ/L-%-% IV SOLN
INTRAVENOUS | Status: DC
  Filled 2022-04-21: qty 1000

## 2022-04-21 MED ORDER — DEXAMETHASONE SODIUM PHOSPHATE 10 MG/ML IJ SOLN
INTRAMUSCULAR | Status: AC
  Filled 2022-04-21: qty 1

## 2022-04-21 MED ORDER — BUPIVACAINE-EPINEPHRINE 0.5% -1:200000 IJ SOLN
INTRAMUSCULAR | Status: DC | PRN
  Administered 2022-04-21: 30 mL

## 2022-04-21 MED ORDER — GABAPENTIN 100 MG PO CAPS
300.0000 mg | ORAL_CAPSULE | Freq: Two times a day (BID) | ORAL | Status: DC
  Administered 2022-04-21 – 2022-04-23 (×4): 300 mg via ORAL
  Filled 2022-04-21 (×4): qty 3

## 2022-04-21 MED ORDER — HYDROMORPHONE HCL 1 MG/ML IJ SOLN: 0.2500 mg | INTRAMUSCULAR | Status: DC | PRN

## 2022-04-21 MED ORDER — ACETAMINOPHEN 500 MG PO TABS
1000.0000 mg | ORAL_TABLET | ORAL | Status: AC
  Administered 2022-04-21: 1000 mg via ORAL
  Filled 2022-04-21: qty 2

## 2022-04-21 MED ORDER — MIDAZOLAM HCL 2 MG/2ML IJ SOLN
INTRAMUSCULAR | Status: AC
  Filled 2022-04-21: qty 2

## 2022-04-21 MED ORDER — ALUM & MAG HYDROXIDE-SIMETH 200-200-20 MG/5ML PO SUSP: 30.0000 mL | Freq: Four times a day (QID) | ORAL | Status: DC | PRN

## 2022-04-21 MED ORDER — BISACODYL 5 MG PO TBEC: 20.0000 mg | DELAYED_RELEASE_TABLET | Freq: Once | ORAL | Status: DC

## 2022-04-21 MED ORDER — MEPERIDINE HCL 50 MG/ML IJ SOLN: 6.2500 mg | INTRAMUSCULAR | Status: DC | PRN

## 2022-04-21 MED ORDER — SACCHAROMYCES BOULARDII 250 MG PO CAPS
250.0000 mg | ORAL_CAPSULE | Freq: Two times a day (BID) | ORAL | Status: DC
  Administered 2022-04-21 – 2022-04-23 (×4): 250 mg via ORAL
  Filled 2022-04-21 (×4): qty 1

## 2022-04-21 MED ORDER — BUPIVACAINE LIPOSOME 1.3 % IJ SUSP
INTRAMUSCULAR | Status: AC
  Filled 2022-04-21: qty 20

## 2022-04-21 MED ORDER — LACTATED RINGERS IV SOLN: INTRAVENOUS | Status: DC

## 2022-04-21 SURGICAL SUPPLY — 98 items
BAG COUNTER SPONGE SURGICOUNT (BAG) ×1 IMPLANT
BLADE EXTENDED COATED 6.5IN (ELECTRODE) IMPLANT
CANNULA REDUC XI 12-8 STAPL (CANNULA) ×1
CANNULA REDUCER 12-8 DVNC XI (CANNULA) IMPLANT
CELLS DAT CNTRL 66122 CELL SVR (MISCELLANEOUS) IMPLANT
COVER SURGICAL LIGHT HANDLE (MISCELLANEOUS) ×2 IMPLANT
COVER TIP SHEARS 8 DVNC (MISCELLANEOUS) ×1 IMPLANT
COVER TIP SHEARS 8MM DA VINCI (MISCELLANEOUS) ×1
DERMABOND ADVANCED .7 DNX12 (GAUZE/BANDAGES/DRESSINGS) IMPLANT
DRAIN CHANNEL 19F RND (DRAIN) IMPLANT
DRAPE ARM DVNC X/XI (DISPOSABLE) ×4 IMPLANT
DRAPE COLUMN DVNC XI (DISPOSABLE) ×1 IMPLANT
DRAPE DA VINCI XI ARM (DISPOSABLE) ×4
DRAPE DA VINCI XI COLUMN (DISPOSABLE) ×1
DRAPE SURG IRRIG POUCH 19X23 (DRAPES) ×1 IMPLANT
DRSG OPSITE POSTOP 4X10 (GAUZE/BANDAGES/DRESSINGS) IMPLANT
DRSG OPSITE POSTOP 4X6 (GAUZE/BANDAGES/DRESSINGS) IMPLANT
DRSG OPSITE POSTOP 4X8 (GAUZE/BANDAGES/DRESSINGS) IMPLANT
ELECT PENCIL ROCKER SW 15FT (MISCELLANEOUS) ×1 IMPLANT
ELECT REM PT RETURN 15FT ADLT (MISCELLANEOUS) ×1 IMPLANT
ENDOLOOP SUT PDS II  0 18 (SUTURE)
ENDOLOOP SUT PDS II 0 18 (SUTURE) IMPLANT
EVACUATOR SILICONE 100CC (DRAIN) IMPLANT
GLOVE BIO SURGEON STRL SZ 6.5 (GLOVE) ×3 IMPLANT
GLOVE BIOGEL PI IND STRL 7.0 (GLOVE) ×2 IMPLANT
GLOVE INDICATOR 6.5 STRL GRN (GLOVE) ×1 IMPLANT
GOWN SRG XL LVL 4 BRTHBL STRL (GOWNS) ×1 IMPLANT
GOWN STRL NON-REIN XL LVL4 (GOWNS) ×1
GOWN STRL REUS W/ TWL XL LVL3 (GOWN DISPOSABLE) ×3 IMPLANT
GOWN STRL REUS W/TWL XL LVL3 (GOWN DISPOSABLE) ×3
GRASPER SUT TROCAR 14GX15 (MISCELLANEOUS) IMPLANT
HOLDER FOLEY CATH W/STRAP (MISCELLANEOUS) ×1 IMPLANT
IRRIG SUCT STRYKERFLOW 2 WTIP (MISCELLANEOUS) ×1
IRRIGATION SUCT STRKRFLW 2 WTP (MISCELLANEOUS) ×1 IMPLANT
KIT PROCEDURE DA VINCI SI (MISCELLANEOUS)
KIT PROCEDURE DVNC SI (MISCELLANEOUS) IMPLANT
KIT TURNOVER KIT A (KITS) IMPLANT
NDL INSUFFLATION 14GA 120MM (NEEDLE) ×1 IMPLANT
NEEDLE INSUFFLATION 14GA 120MM (NEEDLE) ×1 IMPLANT
PACK CARDIOVASCULAR III (CUSTOM PROCEDURE TRAY) ×1 IMPLANT
PACK COLON (CUSTOM PROCEDURE TRAY) ×1 IMPLANT
PAD POSITIONING PINK XL (MISCELLANEOUS) ×1 IMPLANT
RELOAD STAPLE 60 2.5 WHT DVNC (STAPLE) IMPLANT
RELOAD STAPLE 60 3.5 BLU DVNC (STAPLE) IMPLANT
RELOAD STAPLE 60 4.3 GRN DVNC (STAPLE) IMPLANT
RELOAD STAPLER 2.5X60 WHT DVNC (STAPLE) ×1 IMPLANT
RELOAD STAPLER 3.5X60 BLU DVNC (STAPLE) ×1 IMPLANT
RELOAD STAPLER 4.3X60 GRN DVNC (STAPLE) ×2 IMPLANT
RETRACTOR WND ALEXIS 18 MED (MISCELLANEOUS) IMPLANT
RTRCTR WOUND ALEXIS 18CM MED (MISCELLANEOUS)
SCISSORS LAP 5X35 DISP (ENDOMECHANICALS) IMPLANT
SEAL CANN UNIV 5-8 DVNC XI (MISCELLANEOUS) ×3 IMPLANT
SEAL XI 5MM-8MM UNIVERSAL (MISCELLANEOUS) ×3
SEALER VESSEL DA VINCI XI (MISCELLANEOUS) ×1
SEALER VESSEL EXT DVNC XI (MISCELLANEOUS) ×1 IMPLANT
SOLUTION ELECTROLUBE (MISCELLANEOUS) ×1 IMPLANT
SPIKE FLUID TRANSFER (MISCELLANEOUS) IMPLANT
STAPLER 60 DA VINCI SURE FORM (STAPLE) ×1
STAPLER 60 SUREFORM DVNC (STAPLE) IMPLANT
STAPLER CANNULA SEAL DVNC XI (STAPLE) IMPLANT
STAPLER CANNULA SEAL XI (STAPLE)
STAPLER ECHELON POWER CIR 29 (STAPLE) IMPLANT
STAPLER ECHELON POWER CIR 31 (STAPLE) IMPLANT
STAPLER RELOAD 2.5X60 WHITE (STAPLE) ×1
STAPLER RELOAD 2.5X60 WHT DVNC (STAPLE) ×1
STAPLER RELOAD 3.5X60 BLU DVNC (STAPLE) ×1
STAPLER RELOAD 3.5X60 BLUE (STAPLE) ×1
STAPLER RELOAD 4.3X60 GREEN (STAPLE) ×2
STAPLER RELOAD 4.3X60 GRN DVNC (STAPLE) ×2
STOPCOCK 4 WAY LG BORE MALE ST (IV SETS) ×2 IMPLANT
SUT ETHILON 2 0 PS N (SUTURE) IMPLANT
SUT NOVA NAB GS-21 1 T12 (SUTURE) ×2 IMPLANT
SUT PROLENE 2 0 KS (SUTURE) IMPLANT
SUT SILK 2 0 (SUTURE) ×1
SUT SILK 2 0 SH CR/8 (SUTURE) IMPLANT
SUT SILK 2-0 18XBRD TIE 12 (SUTURE) ×1 IMPLANT
SUT SILK 3 0 (SUTURE)
SUT SILK 3 0 SH CR/8 (SUTURE) ×1 IMPLANT
SUT SILK 3-0 18XBRD TIE 12 (SUTURE) IMPLANT
SUT V-LOC BARB 180 2/0GR6 GS22 (SUTURE) ×1
SUT VIC AB 2-0 SH 18 (SUTURE) IMPLANT
SUT VIC AB 2-0 SH 27 (SUTURE) ×1
SUT VIC AB 2-0 SH 27X BRD (SUTURE) IMPLANT
SUT VIC AB 3-0 SH 18 (SUTURE) IMPLANT
SUT VIC AB 4-0 PS2 27 (SUTURE) ×2 IMPLANT
SUT VICRYL 0 UR6 27IN ABS (SUTURE) ×1 IMPLANT
SUTURE V-LC BRB 180 2/0GR6GS22 (SUTURE) IMPLANT
SYR 20ML ECCENTRIC (SYRINGE) ×1 IMPLANT
SYS LAPSCP GELPORT 120MM (MISCELLANEOUS)
SYS WOUND ALEXIS 18CM MED (MISCELLANEOUS)
SYSTEM LAPSCP GELPORT 120MM (MISCELLANEOUS) IMPLANT
SYSTEM WOUND ALEXIS 18CM MED (MISCELLANEOUS) IMPLANT
TOWEL OR 17X26 10 PK STRL BLUE (TOWEL DISPOSABLE) IMPLANT
TOWEL OR NON WOVEN STRL DISP B (DISPOSABLE) ×1 IMPLANT
TRAY FOLEY MTR SLVR 16FR STAT (SET/KITS/TRAYS/PACK) ×1 IMPLANT
TROCAR ADV FIXATION 5X100MM (TROCAR) ×1 IMPLANT
TUBING CONNECTING 10 (TUBING) ×2 IMPLANT
TUBING INSUFFLATION 10FT LAP (TUBING) ×1 IMPLANT

## 2022-04-21 NOTE — Anesthesia Preprocedure Evaluation (Addendum)
Anesthesia Evaluation  Patient identified by MRN, date of birth, ID band Patient awake    Reviewed: Allergy & Precautions, NPO status , Patient's Chart, lab work & pertinent test results  Airway Mallampati: II  TM Distance: >3 FB Neck ROM: Full    Dental no notable dental hx.    Pulmonary neg pulmonary ROS   Pulmonary exam normal breath sounds clear to auscultation       Cardiovascular negative cardio ROS Normal cardiovascular exam Rhythm:Regular Rate:Normal     Neuro/Psych negative neurological ROS  negative psych ROS   GI/Hepatic Neg liver ROS,,,CRC    Endo/Other  negative endocrine ROS    Renal/GU negative Renal ROS  negative genitourinary   Musculoskeletal  (+) Arthritis , Osteoarthritis,    Abdominal   Peds  Hematology  (+) Blood dyscrasia, anemia Hb 11.2 Hx CLL   Anesthesia Other Findings   Reproductive/Obstetrics negative OB ROS                             Anesthesia Physical Anesthesia Plan  ASA: 2  Anesthesia Plan: General   Post-op Pain Management: Tylenol PO (pre-op)*, Ketamine IV* and Dilaudid IV   Induction: Intravenous  PONV Risk Score and Plan: 4 or greater and Ondansetron, Dexamethasone, Midazolam, Treatment may vary due to age or medical condition and Scopolamine patch - Pre-op  Airway Management Planned: Oral ETT  Additional Equipment: None  Intra-op Plan:   Post-operative Plan: Extubation in OR  Informed Consent: I have reviewed the patients History and Physical, chart, labs and discussed the procedure including the risks, benefits and alternatives for the proposed anesthesia with the patient or authorized representative who has indicated his/her understanding and acceptance.     Dental advisory given  Plan Discussed with: CRNA  Anesthesia Plan Comments:         Anesthesia Quick Evaluation

## 2022-04-21 NOTE — H&P (Signed)
REFERRING PHYSICIAN:  Gatha Mayer, MD   PROVIDER:  Monico Blitz, MD   MRN: [id number] DOB: 1970/10/21 DATE OF ENCOUNTER: 03/21/2022   Subjective    Chief Complaint: New Consultation (NEW CANCER - Adenocarcinoma of the colon, )       History of Present Illness: Holly Sanders is a 52 y.o. female who is seen today as an office consultation at the request of Dr. Carlean Purl for evaluation of New Consultation (NEW CANCER - Adenocarcinoma of the colon, ) .  Patient underwent a screening colonoscopy on 03/07/2022.  An infiltrative polypoid mass was found in the cecum.  This was biopsied and tattooed.  Pathology showed invasive adenocarcinoma, moderately differentiated.  CEA was less than 5.  CT scan of the chest abdomen and pelvis shows the lesion in the right colon that appears nonobstructive and some adjacent lymph nodes but no evidence for distant metastatic disease in the chest abdomen and pelvis.  Patient's only surgical history is C-section.  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   Review of Systems: A complete review of systems was obtained from the patient.  I have reviewed this information and discussed as appropriate with the patient.  See HPI as well for other ROS.     Medical History: Past Medical History         Past Medical History:  Diagnosis Date   Anemia     History of cancer          There is no problem list on file for this patient.     Past Surgical History           Past Surgical History:  Procedure Laterality Date   REPEAT CESAREAN SECTION            Allergies  No Known Allergies                 Current Outpatient Medications on File Prior to Visit  Medication Sig Dispense Refill   cholecalciferol, vitamin D3, (VITAMIN D3) 125 mcg (5,000 unit) tablet Take by mouth       diclofenac (VOLTAREN) 1 % topical gel Apply topically       sodium, potassium, and magnesium (SUPREP) oral solution USE 1 KIT BY  MOUTH FOR 1 DOSE.        No current facility-administered medications on file prior to visit.      Family History           Family History  Problem Relation Age of Onset   High blood pressure (Hypertension) Mother     Coronary Artery Disease (Blocked arteries around heart) Mother     Hyperlipidemia (Elevated cholesterol) Father     Hyperlipidemia (Elevated cholesterol) Brother     Coronary Artery Disease (Blocked arteries around heart) Brother          Social History         Tobacco Use  Smoking Status Never  Smokeless Tobacco Never      Social History  Social History           Socioeconomic History   Marital status: Legally Separated  Tobacco Use   Smoking status: Never   Smokeless tobacco: Never  Substance and Sexual Activity   Alcohol use: Yes   Drug use: Never        Objective:      Vitals:   04/21/22 1146  BP: (!) 151/84  Pulse: (!) 105  Resp: 18  Temp: 98.7 F (37.1 C)  SpO2: 98%    Exam Gen: NAD Abd: pubic incisional scar     Labs, Imaging and Diagnostic Testing: Cbc, CT scans, colonoscopy report and recent oncology notes reviewed   Assessment and Plan:  Diagnoses and all orders for this visit:   Colon cancer, ascending (CMS-HCC)     52 year-old female who presents to the office for evaluation of a newly diagnosed ascending colon cancer.  Patient has a stage 0 history of CLL as well.  Surgical history is significant for C-section only.  Metastatic workup has been negative.  CEA is normal.  I have recommended proceeding with minimally invasive partial colectomy.  Risk and benefits of surgery were discussed in detail.  All questions were answered. The surgery and anatomy were described to the patient as well as the risks of surgery and the possible complications.  These include: Bleeding, deep abdominal infections and possible wound complications such as hernia and infection, damage to adjacent structures, leak of surgical connections, which can  lead to other surgeries and possibly an ostomy, possible need for other procedures, such as abscess drains in radiology, possible prolonged hospital stay, possible diarrhea from removal of part of the colon, possible constipation from narcotics, prolonged fatigue/weakness or appetite loss, possible early recurrence of of disease, possible complications of their medical problems such as heart disease or arrhythmias or lung problems, death (less than 1%). I believe the patient understands and wishes to proceed with the surgery.       Rosario Adie, MD Colon and Rectal Surgery [facility]

## 2022-04-21 NOTE — Transfer of Care (Signed)
Immediate Anesthesia Transfer of Care Note  Patient: Holly Sanders  Procedure(s) Performed: XI ROBOT ASSISTED LAPAROSCOPIC PARTIAL COLECTOMY  Patient Location: PACU  Anesthesia Type:General  Level of Consciousness: drowsy and patient cooperative  Airway & Oxygen Therapy: Patient Spontanous Breathing and Patient connected to face mask oxygen  Post-op Assessment: Report given to RN and Post -op Vital signs reviewed and stable  Post vital signs: Reviewed and stable  Last Vitals:  Vitals Value Taken Time  BP 134/84 04/21/22 1709  Temp    Pulse 94 04/21/22 1710  Resp 13 04/21/22 1710  SpO2 100 % 04/21/22 1710  Vitals shown include unvalidated device data.  Last Pain:  Vitals:   04/21/22 1202  TempSrc:   PainSc: 1          Complications: No notable events documented.

## 2022-04-21 NOTE — Anesthesia Procedure Notes (Signed)
Procedure Name: Intubation Date/Time: 04/21/2022 2:16 PM  Performed by: Sharlette Dense, CRNAPre-anesthesia Checklist: Patient identified, Emergency Drugs available, Suction available and Patient being monitored Patient Re-evaluated:Patient Re-evaluated prior to induction Oxygen Delivery Method: Circle system utilized Preoxygenation: Pre-oxygenation with 100% oxygen Induction Type: IV induction Ventilation: Mask ventilation without difficulty Laryngoscope Size: Miller and 2 Grade View: Grade I Tube type: Oral Tube size: 7.5 mm Number of attempts: 1 Airway Equipment and Method: Stylet Placement Confirmation: ETT inserted through vocal cords under direct vision, positive ETCO2 and breath sounds checked- equal and bilateral Secured at: 22 cm Tube secured with: Tape Dental Injury: Teeth and Oropharynx as per pre-operative assessment

## 2022-04-21 NOTE — Op Note (Signed)
04/21/2022  5:01 PM  PATIENT:  Holly Sanders  52 y.o. female  Patient Care Team: [name], MD as PCP - General (Family Medicine) Gatha Mayer, MD as Consulting Physician (Gastroenterology) Bo Merino, MD as Consulting Physician (Rheumatology) Truitt Merle, MD as Consulting Physician (Oncology)  PRE-OPERATIVE DIAGNOSIS:  COLON CANCER  POST-OPERATIVE DIAGNOSIS:  COLON CANCER  PROCEDURE:  XI ROBOT ASSISTED RIGHT COLECTOMY    Surgeon(s): Leighton Ruff, MD  ASSISTANT: none   ANESTHESIA:   local and general  EBL: 132m Total I/O In: 1750 [I.V.:1500; IV Piggyback:250] Out: 100 [Blood:100]  Delay start of Pharmacological VTE agent (>24hrs) due to surgical blood loss or risk of bleeding:  no  DRAINS: none   SPECIMEN:  Source of Specimen:  R colon  DISPOSITION OF SPECIMEN:  PATHOLOGY  COUNTS:  YES  PLAN OF CARE: Admit to inpatient   PATIENT DISPOSITION:  PACU - hemodynamically stable.  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  Metastatic workup was negative.  I recommended segmental resection:  The anatomy & physiology of the digestive tract was discussed.  The pathophysiology was discussed.  Natural history risks without surgery was discussed.   I worked to give an overview of the disease and the frequent need to have multispecialty involvement.  I feel the risks of no intervention will lead to serious problems that outweigh the operative risks; therefore, I recommended a partial colectomy to remove the pathology.  Laparoscopic & open techniques were discussed.   Risks such as bleeding, infection, abscess, leak, reoperation, possible ostomy, hernia, heart attack, death, and other risks were discussed.  I noted a good likelihood this will help address the problem.   Goals of post-operative recovery were discussed as well.    The patient expressed understanding & wished to proceed with surgery.  OR FINDINGS:   Patient had  mass and tattoo in her cecum  No obvious metastatic disease on visceral parietal peritoneum or liver.  DESCRIPTION:   Informed consent was confirmed.  The patient underwent general anaesthesia without difficulty.  The patient was positioned appropriately.  VTE prevention in place.  The patient's abdomen was clipped, prepped, & draped in a sterile fashion.  Surgical timeout confirmed our plan.  The patient was positioned in reverse Trendelenburg.  Abdominal entry was gained using a Varies needle in the LUQ.  Entry was clean.  I induced carbon dioxide insufflation.  An 841mrobotic port was placed in the LUQ.  Camera inspection revealed no injury.  Extra ports were carefully placed under direct laparoscopic visualization.  I laparoscopically reflected the greater omentum and the upper abdomen the small bowel in the peilvis. The patient was appropriately positioned and the robot was docked to the patient's right side.  Instruments were placed under direct visualization.  The omentum was freed from adhesions to the colon and abdominal wall and reflected upwards.   I began by identifying the ileocolic artery and vein within the mesentery. Dissection was bluntly carried around these structures. The duodenum was identified and free from the structures. I then separated the structures bluntly and used the robotic vessel sealer device to transect these.  I developed the retroperitoneal plane bluntly.  I then freed the appendix off its attachments to the pelvic wall. I mobilized the terminal ileum.  I took care to avoid injuring any retroperitoneal structures.  After this I began to mobilize laterally down the white line of Toldt and then took down the  hepatic flexure using the Enseal device. I mobilized the omentum off of the right transverse colon. The entire colon was then flipped medially and mobilized off of the retroperitoneal structures until I could visualize the lateral edge of the duodenum underneath.  I  gently freed the duodenal attachments.   I identified a portion of mesentery of the transverse colon just proximal to the right branch of the middle colic.  I divided up to the colon from the previous dissection of the mesentery using the robotic vessel sealer.  I then divided the terminal ileal mesentery in similar fashion.  At that point, the terminal ileum was divided with a blue load robotic 60 mm stapler.  The transverse colon was divided with 2 green loads of the robotic stapler.  The specimen was then completely free and placed in the right upper quadrant.  Hemostasis was good.  I then oriented the remaining terminal ileum and transverse colon and a isoperistaltic fashion.  I placed an enterotomy in the small bowel and colon using the robotic scissors.  I then introduced a white load 60 mm robotic stapler into both enterotomies and created an anastomosis between the small bowel and transverse colon.  Hemostasis within the staple line was good.  The common enterotomy channel was closed using 2 running 2-0 V-Loc sutures.  The abdomen was then irrigated with normal saline. The omentum was then brought down over the anastomosis.  At this point the robot was undocked.  The 12 mm suprapubic port was enlarged to a Pfannenstiel incision and an [place] wound protector was placed.  The specimen was removed from the abdomen and evaluated.  Once the abdomen was inspected for hemostasis, the [place] wound protector was removed.    The peritoneum of the Pfannenstiel incision was closed using a running 0 Vicryl suture.  The fascia was then closed using #1 Novafil interrupted sutures.  The subcutaneous tissue of the extraction incision was closed using a running 2-0 Vicryl suture. The skin was then closed using running subcuticular 4-0 Vicryl sutures.  A sterile dressing was applied.  The remaining port sites were closed using interrupted 4-0 Vicryl sutures and Dermabond. All counts were correct per operating room staff.  The patient was then awakened from anesthesia and sent to the post anesthesia care unit in stable condition.     Rosario Adie, MD  [facility] [place] [facility]

## 2022-04-22 LAB — BASIC METABOLIC PANEL
Anion gap: 8 (ref 5–15)
BUN: 7 mg/dL (ref 6–20)
CO2: 24 mmol/L (ref 22–32)
Calcium: 8.5 mg/dL — ABNORMAL LOW (ref 8.9–10.3)
Chloride: 104 mmol/L (ref 98–111)
Creatinine, Ser: 0.9 mg/dL (ref 0.44–1.00)
GFR, Estimated: >60 mL/min (ref >60)
Glucose, Bld: 134 mg/dL — ABNORMAL HIGH (ref 70–99)
Potassium: 4.1 mmol/L (ref 3.5–5.1)
Sodium: 136 mmol/L (ref 135–145)

## 2022-04-22 LAB — CBC
HCT: 31.3 % — ABNORMAL LOW (ref 36.0–46.0)
Hemoglobin: 9.8 g/dL — ABNORMAL LOW (ref 12.0–15.0)
MCH: 28.3 pg (ref 26.0–34.0)
MCHC: 31.3 g/dL (ref 30.0–36.0)
MCV: 90.5 fL (ref 80.0–100.0)
Platelets: 261 10*3/uL (ref 150–400)
RBC: 3.46 MIL/uL — ABNORMAL LOW (ref 3.87–5.11)
RDW: 16 % — ABNORMAL HIGH (ref 11.5–15.5)
WBC: 19.1 10*3/uL — ABNORMAL HIGH (ref 4.0–10.5)
nRBC: 0 % (ref 0.0–0.2)

## 2022-04-22 MED ORDER — VITAMIN D3 25 MCG (1000 UNIT) PO TABS
1000.0000 [IU] | ORAL_TABLET | Freq: Every day | ORAL | Status: DC
  Administered 2022-04-22 – 2022-04-23 (×2): 1000 [IU] via ORAL
  Filled 2022-04-22 (×2): qty 1

## 2022-04-22 MED ORDER — PHENOL 1.4 % MT LIQD: 2.0000 | OROMUCOSAL | Status: DC | PRN

## 2022-04-22 MED ORDER — SODIUM CHLORIDE 0.9 % IV SOLN: 250.0000 mL | INTRAVENOUS | Status: DC | PRN

## 2022-04-22 MED ORDER — CALCIUM POLYCARBOPHIL 625 MG PO TABS
625.0000 mg | ORAL_TABLET | Freq: Two times a day (BID) | ORAL | Status: DC
  Administered 2022-04-22 – 2022-04-23 (×3): 625 mg via ORAL
  Filled 2022-04-22 (×3): qty 1

## 2022-04-22 MED ORDER — SODIUM CHLORIDE 0.9% FLUSH: 3.0000 mL | INTRAVENOUS | Status: DC | PRN

## 2022-04-22 MED ORDER — METOPROLOL TARTRATE 5 MG/5ML IV SOLN
5.0000 mg | Freq: Four times a day (QID) | INTRAVENOUS | Status: DC | PRN
  Administered 2022-04-23: 5 mg via INTRAVENOUS
  Filled 2022-04-22: qty 5

## 2022-04-22 MED ORDER — MENTHOL 3 MG MT LOZG: 1.0000 | LOZENGE | OROMUCOSAL | Status: DC | PRN

## 2022-04-22 MED ORDER — SALINE SPRAY 0.65 % NA SOLN: 1.0000 | Freq: Four times a day (QID) | NASAL | Status: DC | PRN

## 2022-04-22 MED ORDER — HYDROMORPHONE HCL 1 MG/ML IJ SOLN
0.5000 mg | INTRAMUSCULAR | Status: DC | PRN
  Administered 2022-04-22: 0.5 mg via INTRAVENOUS
  Filled 2022-04-22: qty 1

## 2022-04-22 MED ORDER — SODIUM CHLORIDE 0.9% FLUSH
3.0000 mL | Freq: Two times a day (BID) | INTRAVENOUS | Status: DC
  Administered 2022-04-22 – 2022-04-23 (×3): 3 mL via INTRAVENOUS

## 2022-04-22 MED ORDER — LACTATED RINGERS IV BOLUS: 1000.0000 mL | Freq: Three times a day (TID) | INTRAVENOUS | Status: DC | PRN

## 2022-04-22 MED ORDER — MAGIC MOUTHWASH: 15.0000 mL | Freq: Four times a day (QID) | ORAL | Status: DC | PRN

## 2022-04-22 MED ORDER — PROCHLORPERAZINE EDISYLATE 10 MG/2ML IJ SOLN: 5.0000 mg | INTRAMUSCULAR | Status: DC | PRN

## 2022-04-22 MED ORDER — LIP MEDEX EX OINT: TOPICAL_OINTMENT | Freq: Two times a day (BID) | CUTANEOUS | Status: DC

## 2022-04-22 NOTE — Progress Notes (Signed)
Holly Sanders [id number] 17-Aug-1970  CARE TEAM:  PCP: [name], MD  Outpatient Care Team: Patient Care Team: [name], MD as PCP - General (Family Medicine) Gatha Mayer, MD as Consulting Physician (Gastroenterology) Bo Merino, MD as Consulting Physician (Rheumatology) Truitt Merle, MD as Consulting Physician (Oncology)  Inpatient Treatment Team: Treatment Team: Attending Provider: Leighton Ruff, MD; Utilization Review: Alease Medina, RN; Licensed Practical Nurse: Wright, [place] E, LPN; Case Manager: Addison Naegeli, RN   Problem List:   Principal Problem:   Colon cancer, ascending ([place])   1 Day Post-Op  04/21/2022  POST-OPERATIVE DIAGNOSIS:  COLON CANCER   PROCEDURE:  XI ROBOT ASSISTED RIGHT COLECTOMY   Surgeon:  Leighton Ruff, MD  OR FINDINGS:    Patient had mass and tattoo in her cecum   No obvious metastatic disease on visceral parietal peritoneum or liver.  Assessment  Recovering relatively well.  [facility] Stay = 1 days)  Plan:  ERAS enhance recovery protocol Gradually advance diet. Stop IV fluids with.  Backup.  Keep on the dry side to avoid fluid overload/ileus. DC Foley per protocol Follow-up on pathology. -VTE prophylaxis- SCDs, etc -mobilize as tolerated to help recovery  Disposition:  Disposition:  The patient is from: Home  Anticipate discharge to:  Home  Anticipated Date of Discharge is:  January 14,2024    Barriers to discharge:  Pending Clinical improvement (more likely than not)  Patient currently is NOT MEDICALLY STABLE for discharge from the hospital from a surgery standpoint.      I reviewed nursing notes, last 24 h vitals and pain scores, last 48 h intake and output, last 24 h labs and trends, and last 24 h imaging results. I have reviewed this patient's available data, including medical history, events of note, test results, etc as part of my evaluation.  A significant portion of that time  was spent in counseling.  Care during the described time interval was provided by me.  This care required moderate level of medical decision making.  04/22/2022    Subjective: (Chief complaint)  Patient with some soreness but feeling better.  Tolerated liquids.  Brother in room.  Objective:  Vital signs:  Vitals:   04/21/22 2125 04/21/22 2200 04/22/22 0118 04/22/22 0535  BP: 139/80  136/73 125/75  Pulse: 96  (!) 105 (!) 108  Resp: '18  17 17  '$ Temp: 99 F (37.2 C) 98.4 F (36.9 C) 98.2 F (36.8 C) 98.7 F (37.1 C)  TempSrc: Axillary Oral Oral Oral  SpO2: 98%  95% 97%  Weight:      Height:           Intake/Output   Yesterday:  01/12 0701 - 01/13 0700 In: 2330 [P.O.:580; I.V.:1500; IV Piggyback:250] Out: 930 [Urine:830; Blood:100] This shift:  No intake/output data recorded.  Bowel function:  Flatus: No  BM:  No  Drain: (No drain)   Physical Exam:  General: Pt awake/alert in no acute distress Eyes: PERRL, normal EOM.  Sclera clear.  No icterus Neuro: CN II-XII intact w/o focal sensory/motor deficits. Lymph: No head/neck/groin lymphadenopathy Psych:  No delerium/psychosis/paranoia.  Oriented x 4 HENT: Normocephalic, Mucus membranes moist.  No thrush Neck: Supple, No tracheal deviation.  No obvious thyromegaly Chest: No pain to chest wall compression.  Good respiratory excursion.  No audible wheezing CV:  Pulses intact.  Regular rhythm.  No major extremity edema MS: Normal AROM mjr joints.  No obvious deformity  Abdomen: Soft.  Nondistended.  Mildly tender at incisions only.  No evidence of peritonitis.  No incarcerated hernias.  Ext:   No deformity.  No mjr edema.  No cyanosis Skin: No petechiae / purpurea.  No major sores.  Warm and dry    Results:   Cultures: No results found for this or any previous visit (from the past 720 hour(s)).  Labs: Results for orders placed or performed during the hospital encounter of 04/21/22 (from the past 48  hour(s))  Pregnancy, urine POC     Status: None   Collection Time: 04/21/22 11:50 AM  Result Value Ref Range   Preg Test, Ur NEGATIVE NEGATIVE    Comment:        THE SENSITIVITY OF THIS METHODOLOGY IS >24 mIU/mL   ABO/Rh     Status: None   Collection Time: 04/21/22 12:03 PM  Result Value Ref Range   ABO/RH(D)      A POS Performed at [facility], [place] [address] [place], [place] [address]   CBC     Status: Abnormal   Collection Time: 04/22/22  6:26 AM  Result Value Ref Range   WBC 19.1 (H) 4.0 - 10.5 K/uL   RBC 3.46 (L) 3.87 - 5.11 MIL/uL   Hemoglobin 9.8 (L) 12.0 - 15.0 g/dL   HCT 31.3 (L) 36.0 - 46.0 %   MCV 90.5 80.0 - 100.0 fL   MCH 28.3 26.0 - 34.0 pg   MCHC 31.3 30.0 - 36.0 g/dL   RDW 16.0 (H) 11.5 - 15.5 %   Platelets 261 150 - 400 K/uL   nRBC 0.0 0.0 - 0.2 %    Comment: Performed at [facility], [place] [address] [place], [place] [address]  Basic metabolic panel     Status: Abnormal   Collection Time: 04/22/22  6:26 AM  Result Value Ref Range   Sodium 136 135 - 145 mmol/L   Potassium 4.1 3.5 - 5.1 mmol/L   Chloride 104 98 - 111 mmol/L   CO2 24 22 - 32 mmol/L   Glucose, Bld 134 (H) 70 - 99 mg/dL    Comment: Glucose reference range applies only to samples taken after fasting for at least 8 hours.   BUN 7 6 - 20 mg/dL   Creatinine, Ser 0.90 0.44 - 1.00 mg/dL   Calcium 8.5 (L) 8.9 - 10.3 mg/dL   GFR, Estimated >60 >60 mL/min    Comment: (NOTE) Calculated using the CKD-EPI Creatinine Equation (2021)    Anion gap 8 5 - 15    Comment: Performed at [facility], [place] [address], [place], [place] [address]    Imaging / Studies: No results found.  Medications / Allergies: per chart  Antibiotics: Anti-infectives (From admission, onward)    Start     Dose/Rate Route Frequency Ordered Stop   04/21/22 1145  cefoTEtan (CEFOTAN) 2 g in sodium chloride 0.9 % 100 mL IVPB        2 g 200 mL/hr over 30 Minutes  Intravenous On call to O.R. 04/21/22 1135 04/21/22 1435         Note: Portions of this report may have been transcribed using voice recognition software. Every effort was made to ensure accuracy; however, inadvertent computerized transcription errors may be present.   Any transcriptional errors that result from this process are unintentional.    Adin Hector, MD, FACS, MASCRS Esophageal, Gastrointestinal & Colorectal Surgery Robotic and Minimally Invasive Surgery  [facility]  [place] [address] [place], [place] [address] [phone] Fax [phone] Main  CONTACT INFORMATION:  Weekday (9AM-5PM): Call [facility] main office at [phone]  Weeknight (5PM-9AM) or Weekend/Holiday: Check [email or web address] (password " [email or web address]") for General Surgery [facility] coverage  (Please, do not use SecureChat as it is not reliable communication to reach operating surgeons for immediate patient care given surgeries/outpatient duties/clinic/cross-coverage/off post-call which would lead to a delay in care.  Epic staff messaging available for outptient concerns, but may not be answered for 48 hours or more).     04/22/2022  8:56 AM

## 2022-04-22 NOTE — Progress Notes (Signed)
  Transition of Care (TOC) Screening Note   Patient Details  Name: Holly Sanders Date of Birth: 1970-05-06   Transition of Care [facility]) CM/SW Contact:    Henrietta Dine, RN Phone Number: 04/22/2022, 5:18 PM    Transition of Care Department [facility]) has reviewed patient and no TOC needs have been identified at this time. We will continue to monitor patient advancement through interdisciplinary progression rounds. If new patient transition needs arise, please place a TOC consult.

## 2022-04-23 LAB — BASIC METABOLIC PANEL
Anion gap: 10 (ref 5–15)
BUN: 7 mg/dL (ref 6–20)
CO2: 24 mmol/L (ref 22–32)
Calcium: 8.5 mg/dL — ABNORMAL LOW (ref 8.9–10.3)
Chloride: 102 mmol/L (ref 98–111)
Creatinine, Ser: 0.78 mg/dL (ref 0.44–1.00)
GFR, Estimated: >60 mL/min (ref >60)
Glucose, Bld: 101 mg/dL — ABNORMAL HIGH (ref 70–99)
Potassium: 3.5 mmol/L (ref 3.5–5.1)
Sodium: 136 mmol/L (ref 135–145)

## 2022-04-23 LAB — CBC
HCT: 30.7 % — ABNORMAL LOW (ref 36.0–46.0)
Hemoglobin: 9.6 g/dL — ABNORMAL LOW (ref 12.0–15.0)
MCH: 28 pg (ref 26.0–34.0)
MCHC: 31.3 g/dL (ref 30.0–36.0)
MCV: 89.5 fL (ref 80.0–100.0)
Platelets: 262 10*3/uL (ref 150–400)
RBC: 3.43 MIL/uL — ABNORMAL LOW (ref 3.87–5.11)
RDW: 16.3 % — ABNORMAL HIGH (ref 11.5–15.5)
WBC: 17.2 10*3/uL — ABNORMAL HIGH (ref 4.0–10.5)
nRBC: 0 % (ref 0.0–0.2)

## 2022-04-23 MED ORDER — TRAMADOL HCL 50 MG PO TABS: 50.0000 mg | ORAL_TABLET | Freq: Four times a day (QID) | ORAL | 0 refills | Status: DC | PRN

## 2022-04-23 MED ORDER — TRAMADOL HCL 50 MG PO TABS: 50.0000 mg | ORAL_TABLET | Freq: Four times a day (QID) | ORAL | Status: DC | PRN

## 2022-04-23 NOTE — Progress Notes (Signed)
Reviewed written D/C instructions with patient and all questions answered. Pt verbalized understanding. Pt left in PMV with all belongings and in stable condition.

## 2022-04-23 NOTE — Anesthesia Postprocedure Evaluation (Signed)
Anesthesia Post Note  Patient: Holly Sanders  Procedure(s) Performed: XI ROBOT ASSISTED LAPAROSCOPIC PARTIAL COLECTOMY     Patient location during evaluation: PACU Anesthesia Type: General Level of consciousness: awake and alert Pain management: pain level controlled Vital Signs Assessment: post-procedure vital signs reviewed and stable Respiratory status: spontaneous breathing, nonlabored ventilation and respiratory function stable Cardiovascular status: blood pressure returned to baseline and stable Postop Assessment: no apparent nausea or vomiting Anesthetic complications: no   No notable events documented.            Darvell Monteforte

## 2022-04-23 NOTE — Progress Notes (Signed)
Holly Sanders [id number] 01-02-1971  CARE TEAM:  PCP: [name], MD  Outpatient Care Team: Patient Care Team: [name], MD as PCP - General (Family Medicine) Gatha Mayer, MD as Consulting Physician (Gastroenterology) Bo Merino, MD as Consulting Physician (Rheumatology) Truitt Merle, MD as Consulting Physician (Oncology)  Inpatient Treatment Team: Treatment Team: Attending Provider: Leighton Ruff, MD; Technician: Ernest Mallick, NT; Respiratory Therapist: [name] Crawford, RRT; Licensed Practical Nurse: Wright, [place] E, LPN; Utilization Review: Alease Medina, RN; Registered Nurse: Dorinda Hill, RN; Case Manager: Addison Naegeli, RN   Problem List:   Principal Problem:   Colon cancer, ascending ([place])   2 Days Post-Op  04/21/2022  POST-OPERATIVE DIAGNOSIS:  COLON CANCER   PROCEDURE:  XI ROBOT ASSISTED RIGHT COLECTOMY   Surgeon:  Leighton Ruff, MD  OR FINDINGS:    Patient had mass and tattoo in her cecum   No obvious metastatic disease on visceral parietal peritoneum or liver.  Assessment  Recovering relatively well.  [facility] Stay = 2 days)  Plan:  ERAS enhance recovery protocol Gradually advance diet -trying solid heart diet. Stop IV fluids with.  Backup.  Keep on the dry side to avoid fluid overload/ileus. DC Foley per protocol Follow-up on pathology. -VTE prophylaxis- SCDs, etc -mobilize as tolerated to help recovery  Disposition: Feeling better.  A little bloated but passing gas.  I think she can leave later this afternoon.  She seems mostly motivated.  Discussed with nursing and patient and they will make decision.  Otherwise follow-up in the morning with Dr. Marcello Moores. Disposition:  The patient is from: Home  Anticipate discharge to:  Home  Anticipated Date of Discharge is:  January 14,2024    Barriers to discharge:  Pending Clinical improvement (more likely than not)  Patient currently is NOT MEDICALLY  STABLE for discharge from the hospital from a surgery standpoint.      I reviewed nursing notes, last 24 h vitals and pain scores, last 48 h intake and output, last 24 h labs and trends, and last 24 h imaging results. I have reviewed this patient's available data, including medical history, events of note, test results, etc as part of my evaluation.  A significant portion of that time was spent in counseling.  Care during the described time interval was provided by me.  This care required moderate level of medical decision making.  04/23/2022    Subjective: (Chief complaint)  Patient walking hallways.  Trying solid food.  Started to pass gas.  A little mild bloating and soreness but nothing too severe.  Leaning towards going to go home later today.  Mildly anxious.  Objective:  Vital signs:  Vitals:   04/22/22 2258 04/23/22 0606 04/23/22 0637 04/23/22 0835  BP:  (!) 146/86  132/77  Pulse:  (!) 114 95 (!) 114  Resp:  20  18  Temp: 98.6 F (37 C) 98.4 F (36.9 C)  98.6 F (37 C)  TempSrc: Oral Oral  Oral  SpO2:  99% 98% 100%  Weight:      Height:        Last BM Date : 04/19/22  Intake/Output   Yesterday:  01/13 0701 - 01/14 0700 In: 680 [P.O.:680] Out: 850 [Urine:850] This shift:  Total I/O In: 240 [P.O.:240] Out: -   Bowel function:  Flatus: No  BM:  No  Drain: (No drain)   Physical Exam:  General: Pt awake/alert in no acute distress Eyes: PERRL, normal EOM.  Sclera clear.  No icterus Neuro: CN II-XII intact w/o focal sensory/motor deficits. Lymph: No head/neck/groin lymphadenopathy Psych:  No delerium/psychosis/paranoia.  Oriented x 4 HENT: Normocephalic, Mucus membranes moist.  No thrush Neck: Supple, No tracheal deviation.  No obvious thyromegaly Chest: No pain to chest wall compression.  Good respiratory excursion.  No audible wheezing CV:  Pulses intact.  Regular rhythm.  No major extremity edema MS: Normal AROM mjr joints.  No obvious  deformity  Abdomen: Soft.  Nondistended.  Mildly tender at incisions only.  No evidence of peritonitis.  No incarcerated hernias.  Ext:   No deformity.  No mjr edema.  No cyanosis Skin: No petechiae / purpurea.  No major sores.  Warm and dry    Results:   Cultures: No results found for this or any previous visit (from the past 720 hour(s)).  Labs: Results for orders placed or performed during the hospital encounter of 04/21/22 (from the past 48 hour(s))  Pregnancy, urine POC     Status: None   Collection Time: 04/21/22 11:50 AM  Result Value Ref Range   Preg Test, Ur NEGATIVE NEGATIVE    Comment:        THE SENSITIVITY OF THIS METHODOLOGY IS >24 mIU/mL   ABO/Rh     Status: None   Collection Time: 04/21/22 12:03 PM  Result Value Ref Range   ABO/RH(D)      A POS Performed at [facility], [place] [address], [place], [place] [address]   CBC     Status: Abnormal   Collection Time: 04/22/22  6:26 AM  Result Value Ref Range   WBC 19.1 (H) 4.0 - 10.5 K/uL   RBC 3.46 (L) 3.87 - 5.11 MIL/uL   Hemoglobin 9.8 (L) 12.0 - 15.0 g/dL   HCT 31.3 (L) 36.0 - 46.0 %   MCV 90.5 80.0 - 100.0 fL   MCH 28.3 26.0 - 34.0 pg   MCHC 31.3 30.0 - 36.0 g/dL   RDW 16.0 (H) 11.5 - 15.5 %   Platelets 261 150 - 400 K/uL   nRBC 0.0 0.0 - 0.2 %    Comment: Performed at [facility], [place] [address] [place], [place] [address]  Basic metabolic panel     Status: Abnormal   Collection Time: 04/22/22  6:26 AM  Result Value Ref Range   Sodium 136 135 - 145 mmol/L   Potassium 4.1 3.5 - 5.1 mmol/L   Chloride 104 98 - 111 mmol/L   CO2 24 22 - 32 mmol/L   Glucose, Bld 134 (H) 70 - 99 mg/dL    Comment: Glucose reference range applies only to samples taken after fasting for at least 8 hours.   BUN 7 6 - 20 mg/dL   Creatinine, Ser 0.90 0.44 - 1.00 mg/dL   Calcium 8.5 (L) 8.9 - 10.3 mg/dL   GFR, Estimated >60 >60 mL/min    Comment: (NOTE) Calculated using the CKD-EPI  Creatinine Equation (2021)    Anion gap 8 5 - 15    Comment: Performed at [facility], [place] [address] [place], [place] [address]  CBC     Status: Abnormal   Collection Time: 04/23/22  5:33 AM  Result Value Ref Range   WBC 17.2 (H) 4.0 - 10.5 K/uL   RBC 3.43 (L) 3.87 - 5.11 MIL/uL   Hemoglobin 9.6 (L) 12.0 - 15.0 g/dL   HCT 30.7 (L) 36.0 - 46.0 %   MCV 89.5 80.0 -  100.0 fL   MCH 28.0 26.0 - 34.0 pg   MCHC 31.3 30.0 - 36.0 g/dL   RDW 16.3 (H) 11.5 - 15.5 %   Platelets 262 150 - 400 K/uL   nRBC 0.0 0.0 - 0.2 %    Comment: Performed at [facility], [place] [address], [place], [place] [address]  Basic metabolic panel     Status: Abnormal   Collection Time: 04/23/22  5:33 AM  Result Value Ref Range   Sodium 136 135 - 145 mmol/L   Potassium 3.5 3.5 - 5.1 mmol/L   Chloride 102 98 - 111 mmol/L   CO2 24 22 - 32 mmol/L   Glucose, Bld 101 (H) 70 - 99 mg/dL    Comment: Glucose reference range applies only to samples taken after fasting for at least 8 hours.   BUN 7 6 - 20 mg/dL   Creatinine, Ser 0.78 0.44 - 1.00 mg/dL   Calcium 8.5 (L) 8.9 - 10.3 mg/dL   GFR, Estimated >60 >60 mL/min    Comment: (NOTE) Calculated using the CKD-EPI Creatinine Equation (2021)    Anion gap 10 5 - 15    Comment: Performed at [facility], [place] [address], [place], [place] [address]    Imaging / Studies: No results found.  Medications / Allergies: per chart  Antibiotics: Anti-infectives (From admission, onward)    Start     Dose/Rate Route Frequency Ordered Stop   04/21/22 1145  cefoTEtan (CEFOTAN) 2 g in sodium chloride 0.9 % 100 mL IVPB        2 g 200 mL/hr over 30 Minutes Intravenous On call to O.R. 04/21/22 1135 04/21/22 1435         Note: Portions of this report may have been transcribed using voice recognition software. Every effort was made to ensure accuracy; however, inadvertent computerized transcription errors may be present.    Any transcriptional errors that result from this process are unintentional.    Adin Hector, MD, FACS, MASCRS Esophageal, Gastrointestinal & Colorectal Surgery Robotic and Minimally Invasive Surgery  [facility]. [address] [place], [place] [address] [phone] Fax [phone] Main  CONTACT INFORMATION:  Weekday (9AM-5PM): Call [facility] main office at [phone]  Weeknight (5PM-9AM) or Weekend/Holiday: Check [email or web address] (password " [email or web address]") for General Surgery [facility] coverage  (Please, do not use SecureChat as it is not reliable communication to reach operating surgeons for immediate patient care given surgeries/outpatient duties/clinic/cross-coverage/off post-call which would lead to a delay in care.  Epic staff messaging available for outptient concerns, but may not be answered for 48 hours or more).     04/23/2022  8:49 AM

## 2022-04-23 NOTE — Discharge Summary (Signed)
Physician Discharge Summary    Patient ID: Holly Sanders MRN: [id number] DOB/AGE: 54-18-1972  52 y.o.  Patient Care Team: [name], MD as PCP - General (Family Medicine) Gatha Mayer, MD as Consulting Physician (Gastroenterology) Bo Merino, MD as Consulting Physician (Rheumatology) Truitt Merle, MD as Consulting Physician (Oncology) Leighton Ruff, MD as Consulting Physician (General Surgery)  Admit date: 04/21/2022  Discharge date: 04/23/2022  Hospital Stay = 2 days    Discharge Diagnoses:  Principal Problem:   Colon cancer, ascending ([place])   2 Days Post-Op  04/21/2022  POST-OPERATIVE DIAGNOSIS:   COLON CANCER  SURGERY:  04/21/2022  Procedure(s): XI ROBOT ASSISTED LAPAROSCOPIC PARTIAL COLECTOMY  SURGEON:    Surgeon(s): Leighton Ruff, MD  Consults: Case Management / Social Work and Anesthesia  Hospital Course:   The patient underwent the surgery above.  Postoperatively, the patient gradually mobilized and advanced to a solid diet.  Pain and other symptoms were treated aggressively.    By the time of discharge, the patient was walking well the hallways, eating food, having flatus.  Pain was well-controlled on an oral medications.  Based on meeting discharge criteria and continuing to recover, I felt it was safe for the patient to be discharged from the hospital to further recover with close followup. Postoperative recommendations were discussed in detail.  They are written as well.  Discharged Condition: good  Discharge Exam: Blood pressure 117/65, pulse (!) 115, temperature 98.6 F (37 C), temperature source Oral, resp. rate 18, height '5\' 7"'$  (1.702 m), weight 88.5 kg, SpO2 98 %.  General: Pt awake/alert/oriented x4 in No acute distress Eyes: PERRL, normal EOM.  Sclera clear.  No icterus Neuro: CN II-XII intact w/o focal sensory/motor deficits. Lymph: No head/neck/groin lymphadenopathy Psych:  No delerium/psychosis/paranoia HENT:  Normocephalic, Mucus membranes moist.  No thrush Neck: Supple, No tracheal deviation Chest:  No chest wall pain w good excursion CV:  Pulses intact.  Regular rhythm MS: Normal AROM mjr joints.  No obvious deformity Abdomen: Soft.  Nondistended.  Mildly tender at incisions only.  No evidence of peritonitis.  No incarcerated hernias. Ext:  SCDs BLE.  No mjr edema.  No cyanosis Skin: No petechiae / purpura   Disposition:    Follow-up Information     Leighton Ruff, MD Follow up on 05/10/2022.   Specialties: General Surgery, Colon and Rectal Surgery Why: To follow up after your hospital stay Contact information: [place] [address] [phone]                 Discharge disposition: 01-Home or Self Care       Discharge Instructions     Call MD for:   Complete by: As directed    FEVER > 101.5 F  (temperatures < 101.5 F are not significant)   Call MD for:  extreme fatigue   Complete by: As directed    Call MD for:  persistant dizziness or light-headedness   Complete by: As directed    Call MD for:  persistant nausea and vomiting   Complete by: As directed    Call MD for:  redness, tenderness, or signs of infection (pain, swelling, redness, odor or green/yellow discharge around incision site)   Complete by: As directed    Call MD for:  severe uncontrolled pain   Complete by: As directed    Diet - low sodium heart healthy   Complete by: As directed    Start with a bland diet  such as soups, liquids, starchy foods, low fat foods, etc. the first few days at home. Gradually advance to a solid, low-fat, high fiber diet by the end of the first week at home.   Add a fiber supplement to your diet (Metamucil, etc) If you feel full, bloated, or constipated, stay on a full liquid or pureed/blenderized diet for a few days until you feel better and are no longer constipated.   Discharge instructions   Complete by: As directed    See Discharge  Instructions If you are not getting better after two weeks or are noticing you are getting worse, contact our office [phone] for further advice.  We may need to adjust your medications, re-evaluate you in the office, send you to the emergency room, or see what other things we can do to help. The clinic staff is available to answer your questions during regular business hours (8:30am-5pm).  Please don't hesitate to call and ask to speak to one of our nurses for clinical concerns.    A surgeon from [facility] is always on call at the hospitals 24 hours/day If you have a medical emergency, go to the nearest emergency room or call 911.   Discharge wound care:   Complete by: As directed    It is good for closed incisions and even open wounds to be washed every day.  Shower every day.  Short baths are fine.  Wash the incisions and wounds clean with soap & water.    You may leave closed incisions open to air if it is dry.   You may cover the incision with clean gauze & replace it after your daily shower for comfort.  DERMABOND:  You have purple skin glue (Dermabond) on your incision(s).  Leave them in place, and they will fall off on their own like a scab in 2-3 weeks.  You may trim any edges that curl up with clean scissors.   Driving Restrictions   Complete by: As directed    You may drive when: - you are no longer taking narcotic prescription pain medication - you can comfortably wear a seatbelt - you can safely make sudden turns/stops without pain.   Increase activity slowly   Complete by: As directed    Start light daily activities --- self-care, walking, climbing stairs- beginning the day after surgery.  Gradually increase activities as tolerated.  Control your pain to be active.  Stop when you are tired.  Ideally, walk several times a day, eventually an hour a day.   Most people are back to most day-to-day activities in a few weeks.  It takes 4-6 weeks to get back to  unrestricted, intense activity. If you can walk 30 minutes without difficulty, it is safe to try more intense activity such as jogging, treadmill, bicycling, low-impact aerobics, swimming, etc. Save the most intensive and strenuous activity for last (Usually 4-8 weeks after surgery) such as sit-ups, heavy lifting, contact sports, etc.  Refrain from any intense heavy lifting or straining until you are off narcotics for pain control.  You will have off days, but things should improve week-by-week. DO NOT PUSH THROUGH PAIN.  Let pain be your guide: If it hurts to do something, don't do it.   Lifting restrictions   Complete by: As directed    If you can walk 30 minutes without difficulty, it is safe to try more intense activity such as jogging, treadmill, bicycling, low-impact aerobics, swimming, etc. Save the most intensive  and strenuous activity for last (Usually 4-8 weeks after surgery) such as sit-ups, heavy lifting, contact sports, etc.   Refrain from any intense heavy lifting or straining until you are off narcotics for pain control.  You will have off days, but things should improve week-by-week. DO NOT PUSH THROUGH PAIN.  Let pain be your guide: If it hurts to do something, don't do it.  Pain is your body warning you to avoid that activity for another week until the pain goes down.   May shower / Bathe   Complete by: As directed    May walk up steps   Complete by: As directed    Remove dressing in 72 hours   Complete by: As directed    Make sure all dressings are removed by the third day after surgery.  Leave incisions open to air.  OK to cover incisions with gauze or bandages as desired   Sexual Activity Restrictions   Complete by: As directed    You may have sexual intercourse when it is comfortable. If it hurts to do something, stop.       Allergies as of 04/23/2022   No Known Allergies      Medication List     TAKE these medications    ferrous sulfate 325 (65 FE) MG  tablet Take 325 mg by mouth daily.   traMADol 50 MG tablet Commonly known as: ULTRAM Take 1-2 tablets (50-100 mg total) by mouth every 6 (six) hours as needed for moderate pain or severe pain.   Vitamin D-3 125 MCG (5000 UT) Tabs Take 1 tablet by mouth daily.               Discharge Care Instructions  (From admission, onward)           Start     Ordered   04/23/22 0000  Discharge wound care:       Comments: It is good for closed incisions and even open wounds to be washed every day.  Shower every day.  Short baths are fine.  Wash the incisions and wounds clean with soap & water.    You may leave closed incisions open to air if it is dry.   You may cover the incision with clean gauze & replace it after your daily shower for comfort.  DERMABOND:  You have purple skin glue (Dermabond) on your incision(s).  Leave them in place, and they will fall off on their own like a scab in 2-3 weeks.  You may trim any edges that curl up with clean scissors.   04/23/22 1645            Significant Diagnostic Studies:  Results for orders placed or performed during the hospital encounter of 04/21/22 (from the past 72 hour(s))  Pregnancy, urine POC     Status: None   Collection Time: 04/21/22 11:50 AM  Result Value Ref Range   Preg Test, Ur NEGATIVE NEGATIVE    Comment:        THE SENSITIVITY OF THIS METHODOLOGY IS >24 mIU/mL   ABO/Rh     Status: None   Collection Time: 04/21/22 12:03 PM  Result Value Ref Range   ABO/RH(D)      A POS Performed at [facility], [place] [address], [place], [place] [address]   CBC     Status: Abnormal   Collection Time: 04/22/22  6:26 AM  Result Value Ref Range   WBC 19.1 (H) 4.0 - 10.5  K/uL   RBC 3.46 (L) 3.87 - 5.11 MIL/uL   Hemoglobin 9.8 (L) 12.0 - 15.0 g/dL   HCT 31.3 (L) 36.0 - 46.0 %   MCV 90.5 80.0 - 100.0 fL   MCH 28.3 26.0 - 34.0 pg   MCHC 31.3 30.0 - 36.0 g/dL   RDW 16.0 (H) 11.5 - 15.5 %   Platelets 261 150 -  400 K/uL   nRBC 0.0 0.0 - 0.2 %    Comment: Performed at [facility], [place] [address], [place], [place] [address]  Basic metabolic panel     Status: Abnormal   Collection Time: 04/22/22  6:26 AM  Result Value Ref Range   Sodium 136 135 - 145 mmol/L   Potassium 4.1 3.5 - 5.1 mmol/L   Chloride 104 98 - 111 mmol/L   CO2 24 22 - 32 mmol/L   Glucose, Bld 134 (H) 70 - 99 mg/dL    Comment: Glucose reference range applies only to samples taken after fasting for at least 8 hours.   BUN 7 6 - 20 mg/dL   Creatinine, Ser 0.90 0.44 - 1.00 mg/dL   Calcium 8.5 (L) 8.9 - 10.3 mg/dL   GFR, Estimated >60 >60 mL/min    Comment: (NOTE) Calculated using the CKD-EPI Creatinine Equation (2021)    Anion gap 8 5 - 15    Comment: Performed at [facility], [place] [address] [place], [place] [address]  CBC     Status: Abnormal   Collection Time: 04/23/22  5:33 AM  Result Value Ref Range   WBC 17.2 (H) 4.0 - 10.5 K/uL   RBC 3.43 (L) 3.87 - 5.11 MIL/uL   Hemoglobin 9.6 (L) 12.0 - 15.0 g/dL   HCT 30.7 (L) 36.0 - 46.0 %   MCV 89.5 80.0 - 100.0 fL   MCH 28.0 26.0 - 34.0 pg   MCHC 31.3 30.0 - 36.0 g/dL   RDW 16.3 (H) 11.5 - 15.5 %   Platelets 262 150 - 400 K/uL   nRBC 0.0 0.0 - 0.2 %    Comment: Performed at [facility], [place] [address] [place], [place] [address]  Basic metabolic panel     Status: Abnormal   Collection Time: 04/23/22  5:33 AM  Result Value Ref Range   Sodium 136 135 - 145 mmol/L   Potassium 3.5 3.5 - 5.1 mmol/L   Chloride 102 98 - 111 mmol/L   CO2 24 22 - 32 mmol/L   Glucose, Bld 101 (H) 70 - 99 mg/dL    Comment: Glucose reference range applies only to samples taken after fasting for at least 8 hours.   BUN 7 6 - 20 mg/dL   Creatinine, Ser 0.78 0.44 - 1.00 mg/dL   Calcium 8.5 (L) 8.9 - 10.3 mg/dL   GFR, Estimated >60 >60 mL/min    Comment: (NOTE) Calculated using the CKD-EPI Creatinine Equation (2021)    Anion gap 10 5 - 15     Comment: Performed at [facility], [place] [address] [place], [place] [address]    No results found.  Past Medical History:  Diagnosis Date   Anemia    Cancer ([place])    CLL   Leukocytosis    Lupus erythematosus    Vitamin D deficiency     Past Surgical History:  Procedure Laterality Date   CESAREAN SECTION     COLONOSCOPY  03/07/2022    Social History   Socioeconomic History  Marital status: Legally Separated    Spouse name: Not on file   Number of children: Not on file   Years of education: Not on file   Highest education level: Not on file  Occupational History   Not on file  Tobacco Use   Smoking status: Never   Smokeless tobacco: Never  Vaping Use   Vaping Use: Never used  Substance and Sexual Activity   Alcohol use: No    Comment: rare, wine   Drug use: No   Sexual activity: Not on file  Other Topics Concern   Not on file  Social History Narrative   Not on file   Social Determinants of Health   Financial Resource Strain: Not on file  Food Insecurity: Unknown (04/21/2022)   Hunger Vital Sign    Worried About Running Out of Food in the Last Year: Patient refused    Ran Out of Food in the Last Year: Patient refused  Transportation Needs: Unknown (04/21/2022)   PRAPARE - Hydrologist (Medical): Patient refused    Lack of Transportation (Non-Medical): Patient refused  Physical Activity: Not on file  Stress: Not on file  Social Connections: Not on file  Intimate Partner Violence: Unknown (04/21/2022)   Humiliation, Afraid, Rape, and Kick questionnaire    Fear of Current or Ex-Partner: Patient refused    Emotionally Abused: Patient refused    Physically Abused: Patient refused    Sexually Abused: Patient refused    Family History  Problem Relation Age of Onset   Heart disease Mother    Lymphoma Father    Squamous cell carcinoma Father    Basal cell carcinoma Father    Heart disease Brother    Cancer  Paternal Aunt        breast   Colon polyps Paternal Grandmother    Colon cancer Paternal Grandmother    Esophageal cancer Paternal Grandfather    Cancer Cousin        breast   Rectal cancer Neg Hx    Stomach cancer Neg Hx     Current Facility-Administered Medications  Medication Dose Route Frequency Provider Last Rate Last Admin   0.9 %  sodium chloride infusion  250 mL Intravenous PRN Michael Boston, MD       alum & mag hydroxide-simeth (MAALOX/MYLANTA) 200-200-20 MG/5ML suspension 30 mL  30 mL Oral O7S PRN Leighton Ruff, MD       alvimopan (ENTEREG) capsule 12 mg  12 mg Oral BID Leighton Ruff, MD   12 mg at 04/23/22 0755   cholecalciferol (VITAMIN D3) tablet 1,000 Units  1,000 Units Oral Daily Michael Boston, MD   1,000 Units at 04/23/22 0756   diphenhydrAMINE (BENADRYL) 12.5 MG/5ML elixir 12.5 mg  12.5 mg Oral J6G PRN Leighton Ruff, MD       Or   diphenhydrAMINE (BENADRYL) injection 12.5 mg  12.5 mg Intravenous E3M PRN Leighton Ruff, MD       enoxaparin (LOVENOX) injection 40 mg  40 mg Subcutaneous O29U Leighton Ruff, MD   40 mg at 04/23/22 0755   feeding supplement (ENSURE SURGERY) liquid 237 mL  237 mL Oral BID BM Leighton Ruff, MD   765 mL at 04/23/22 1358   gabapentin (NEURONTIN) capsule 300 mg  300 mg Oral BID Leighton Ruff, MD   465 mg at 04/23/22 0758   HYDROmorphone (DILAUDID) injection 0.5-1 mg  0.5-1 mg Intravenous Q4H PRN Michael Boston, MD   0.5 mg at 04/22/22 1028  lactated ringers bolus 1,000 mL  1,000 mL Intravenous Q8H PRN Michael Boston, MD       lip balm (CARMEX) ointment   Topical BID Michael Boston, MD   Given at 04/23/22 9480   magic mouthwash  15 mL Oral QID PRN Michael Boston, MD       menthol-cetylpyridinium (CEPACOL) lozenge 3 mg  1 lozenge Oral PRN Michael Boston, MD       metoprolol tartrate (LOPRESSOR) injection 5 mg  5 mg Intravenous Q6H PRN Michael Boston, MD   5 mg at 04/23/22 0611   ondansetron (ZOFRAN) tablet 4 mg  4 mg Oral X6P PRN Leighton Ruff, MD        Or   ondansetron [facility]) injection 4 mg  4 mg Intravenous V3Z PRN Leighton Ruff, MD       phenol (CHLORASEPTIC) mouth spray 2 spray  2 spray Mouth/Throat PRN Michael Boston, MD       polycarbophil (FIBERCON) tablet 625 mg  625 mg Oral BID Michael Boston, MD   625 mg at 04/23/22 0757   prochlorperazine (COMPAZINE) injection 5-10 mg  5-10 mg Intravenous Q4H PRN Michael Boston, MD       saccharomyces boulardii (FLORASTOR) capsule 250 mg  250 mg Oral BID Leighton Ruff, MD   482 mg at 04/23/22 0756   simethicone (MYLICON) chewable tablet 40 mg  40 mg Oral L0B PRN Leighton Ruff, MD   40 mg at 04/23/22 0526   sodium chloride (OCEAN) 0.65 % nasal spray 1-2 spray  1-2 spray Each Nare Q6H PRN Michael Boston, MD       sodium chloride flush (NS) 0.9 % injection 3 mL  3 mL Intravenous Gorden Harms, MD   3 mL at 04/23/22 0935   sodium chloride flush (NS) 0.9 % injection 3 mL  3 mL Intravenous PRN Michael Boston, MD       traMADol Veatrice Bourbon) tablet 50-100 mg  50-100 mg Oral Q6H PRN Michael Boston, MD         No Known Allergies  Signed:   Adin Hector, MD, FACS, MASCRS Esophageal, Gastrointestinal & Colorectal Surgery Robotic and Minimally Invasive Surgery  [facility] [place] [address], [place], [place] [address] [phone] Fax [phone] Main  CONTACT INFORMATION:  Weekday (9AM-5PM): Call [facility] main office at [phone]  Weeknight (5PM-9AM) or Weekend/Holiday: Check [email or web address] (password " [email or web address]") for General Surgery [facility] coverage  (Please, do not use SecureChat as it is not reliable communication to reach operating surgeons for immediate patient care given surgeries/outpatient duties/clinic/cross-coverage/off post-call which would lead to a delay in care.  Epic staff messaging available for outptient concerns, but may not be answered for 48 hours or more).     04/23/2022, 4:48 PM

## 2022-04-23 NOTE — Progress Notes (Signed)
   04/23/22 0606  Assess: MEWS Score  Temp 98.4 F (36.9 C)  BP (!) 146/86  MAP (mmHg) 102  Pulse Rate (!) 114  Resp 20  SpO2 99 %  O2 Device Room Air  Assess: MEWS Score  MEWS Temp 0  MEWS Systolic 0  MEWS Pulse 2  MEWS RR 0  MEWS LOC 0  MEWS Score 2  MEWS Score Color Yellow  Assess: if the MEWS score is Yellow or Red  Were vital signs taken at a resting state? Yes  Focused Assessment Change from prior assessment (see assessment flowsheet)  Does the patient meet 2 or more of the SIRS criteria? No  MEWS guidelines implemented *See Row Information* Yes  Treat  MEWS Interventions Administered prn meds/treatments  Pain Scale 0-10  Pain Score 0  Take Vital Signs  Increase Vital Sign Frequency  Yellow: Q 2hr X 2 then Q 4hr X 2, if remains yellow, continue Q 4hrs  Escalate  MEWS: Escalate Yellow: discuss with charge nurse/RN and consider discussing with provider and RRT  Notify: Charge Nurse/RN  Name of Charge Nurse/RN Notified Rupinder Kaur,RN  Date Charge Nurse/RN Notified 04/23/22  Time Charge Nurse/RN Notified 6060  Provider Notification  Provider Name/Title Greer Pickerel  Date Provider Notified 04/23/22  Time Provider Notified 0617  Method of Notification Page  Notification Reason Other (Comment) (yellow mews d/t elevated HR 114)  Provider response No new orders  Assess: SIRS CRITERIA  SIRS Temperature  0  SIRS Pulse 1  SIRS Respirations  0  SIRS WBC 0  SIRS Score Sum  1

## 2022-04-23 NOTE — Discharge Instructions (Signed)
SURGERY: POST OP INSTRUCTIONS (Surgery for small bowel obstruction, colon resection, etc)   ######################################################################  EAT Gradually transition to a high fiber diet with a fiber supplement over the next few days after discharge  WALK Walk an hour a day.  Control your pain to do that.    CONTROL PAIN Control pain so that you can walk, sleep, tolerate sneezing/coughing, go up/down stairs.  HAVE A BOWEL MOVEMENT DAILY Keep your bowels regular to avoid problems.  OK to try a laxative to override constipation.  OK to use an antidairrheal to slow down diarrhea.  Call if not better after 2 tries  CALL IF YOU HAVE PROBLEMS/CONCERNS Call if you are still struggling despite following these instructions. Call if you have concerns not answered by these instructions  ######################################################################   DIET Follow a light diet the first few days at home.  Start with a bland diet such as soups, liquids, starchy foods, low fat foods, etc.  If you feel full, bloated, or constipated, stay on a ful liquid or pureed/blenderized diet for a few days until you feel better and no longer constipated. Be sure to drink plenty of fluids every day to avoid getting dehydrated (feeling dizzy, not urinating, etc.). Gradually add a fiber supplement to your diet over the next week.  Gradually get back to a regular solid diet.  Avoid fast food or heavy meals the first week as you are more likely to get nauseated. It is expected for your digestive tract to need a few months to get back to normal.  It is common for your bowel movements and stools to be irregular.  You will have occasional bloating and cramping that should eventually fade away.  Until you are eating solid food normally, off all pain medications, and back to regular activities; your bowels will not be normal. Focus on eating a low-fat, high fiber diet the rest of your life  (See Getting to [place], below).  CARE of your INCISION or WOUND  It is good for closed incisions and even open wounds to be washed every day.  Shower every day.  Short baths are fine.  Wash the incisions and wounds clean with soap & water.    You may leave closed incisions open to air if it is dry.   You may cover the incision with clean gauze & replace it after your daily shower for comfort.  DERMABOND:  You have purple skin glue (Dermabond) on your incision(s).  Leave them in place, and they will fall off on their own like a scab in 2-3 weeks.  You may trim any edges that curl up with clean scissors.    If you have an open wound with a wound vac, see wound vac care instructions.    ACTIVITIES as tolerated Start light daily activities --- self-care, walking, climbing stairs-- beginning the day after surgery.  Gradually increase activities as tolerated.  Control your pain to be active.  Stop when you are tired.  Ideally, walk several times a day, eventually an hour a day.   Most people are back to most day-to-day activities in a few weeks.  It takes 4-8 weeks to get back to unrestricted, intense activity. If you can walk 30 minutes without difficulty, it is safe to try more intense activity such as jogging, treadmill, bicycling, low-impact aerobics, swimming, etc. Save the most intensive and strenuous activity for last (Usually 4-8 weeks after surgery) such as sit-ups, heavy lifting, contact sports, etc.  Refrain  from any intense heavy lifting or straining until you are off narcotics for pain control.  You will have off days, but things should improve week-by-week. DO NOT PUSH THROUGH PAIN.  Let pain be your guide: If it hurts to do something, don't do it.  Pain is your body warning you to avoid that activity for another week until the pain goes down. You may drive when you are no longer taking narcotic prescription pain medication, you can comfortably wear a seatbelt, and you can  safely make sudden turns/stops to protect yourself without hesitating due to pain. You may have sexual intercourse when it is comfortable. If it hurts to do something, stop.  MEDICATIONS Take your usually prescribed home medications unless otherwise directed.   Blood thinners:  Usually you can restart any strong blood thinners after the second postoperative day.  It is OK to take aspirin right away.     If you are on strong blood thinners (warfarin/Coumadin, Plavix, Xerelto, Eliquis, Pradaxa, etc), discuss with your surgeon, medicine PCP, and/or cardiologist for instructions on when to restart the blood thinner & if blood monitoring is needed (PT/INR blood check, etc).     PAIN CONTROL Pain after surgery or related to activity is often due to strain/injury to muscle, tendon, nerves and/or incisions.  This pain is usually short-term and will improve in a few months.  To help speed the process of healing and to get back to regular activity more quickly, DO THE FOLLOWING THINGS TOGETHER: Increase activity gradually.  DO NOT PUSH THROUGH PAIN Use Ice and/or Heat Try Gentle Massage and/or Stretching Take over the counter pain medication Take Narcotic prescription pain medication for more severe pain  Good pain control = faster recovery.  It is better to take more medicine to be more active than to stay in bed all day to avoid medications.  Increase activity gradually Avoid heavy lifting at first, then increase to lifting as tolerated over the next 6 weeks. Do not "push through" the pain.  Listen to your body and avoid positions and maneuvers than reproduce the pain.  Wait a few days before trying something more intense Walking an hour a day is encouraged to help your body recover faster and more safely.  Start slowly and stop when getting sore.  If you can walk 30 minutes without stopping or pain, you can try more intense activity (running, jogging, aerobics, cycling, swimming, treadmill, sex,  sports, weightlifting, etc.) Remember: If it hurts to do it, then don't do it! Use Ice and/or Heat You will have swelling and bruising around the incisions.  This will take several weeks to resolve. Ice packs or heating pads (6-8 times a day, 30-60 minutes at a time) will help sooth soreness & bruising. Some people prefer to use ice alone, heat alone, or alternate between ice & heat.  Experiment and see what works best for you.  Consider trying ice for the first few days to help decrease swelling and bruising; then, switch to heat to help relax sore spots and speed recovery. Shower every day.  Short baths are fine.  It feels good!  Keep the incisions and wounds clean with soap & water.   Try Gentle Massage and/or Stretching Massage at the area of pain many times a day Stop if you feel pain - do not overdo it Take over the counter pain medication This helps the muscle and nerve tissues become less irritable and calm down faster Choose ONE of the following over-the-counter  anti-inflammatory medications: Acetaminophen '500mg'$  tabs (Tylenol) 1-2 pills with every meal and just before bedtime (avoid if you have liver problems or if you have acetaminophen in you narcotic prescription) Naproxen '220mg'$  tabs (ex. Aleve, Naprosyn) 1-2 pills twice a day (avoid if you have kidney, stomach, IBD, or bleeding problems) Ibuprofen '200mg'$  tabs (ex. Advil, Motrin) 3-4 pills with every meal and just before bedtime (avoid if you have kidney, stomach, IBD, or bleeding problems) Take with food/snack several times a day as directed for at least 2 weeks to help keep pain / soreness down & more manageable. Take Narcotic prescription pain medication for more severe pain A prescription for strong pain control is often given to you upon discharge (for example: oxycodone/Percocet, hydrocodone/Norco/Vicodin, or tramadol/Ultram) Take your pain medication as prescribed. Be mindful that most narcotic prescriptions contain Tylenol  (acetaminophen) as well - avoid taking too much Tylenol. If you are having problems/concerns with the prescription medicine (does not control pain, nausea, vomiting, rash, itching, etc.), please call us [phone] to see if we need to switch you to a different pain medicine that will work better for you and/or control your side effects better. If you need a refill on your pain medication, you must call the office before 4 pm and on weekdays only.  By federal law, prescriptions for narcotics cannot be called into a pharmacy.  They must be filled out on paper & picked up from our office by the patient or authorized caretaker.  Prescriptions cannot be filled after 4 pm nor on weekends.    WHEN TO CALL us [phone] Severe uncontrolled or worsening pain  Fever over 101 F (38.5 C) Concerns with the incision: Worsening pain, redness, rash/hives, swelling, bleeding, or drainage Reactions / problems with new medications (itching, rash, hives, nausea, etc.) Nausea and/or vomiting Difficulty urinating Difficulty breathing Worsening fatigue, dizziness, lightheadedness, blurred vision Other concerns If you are not getting better after two weeks or are noticing you are getting worse, contact our office [phone] for further advice.  We may need to adjust your medications, re-evaluate you in the office, send you to the emergency room, or see what other things we can do to help. The clinic staff is available to answer your questions during regular business hours (8:30am-5pm).  Please don't hesitate to call and ask to speak to one of our nurses for clinical concerns.    A surgeon from [facility] is always on call at the hospitals 24 hours/day If you have a medical emergency, go to the nearest emergency room or call 911.  FOLLOW UP in our office One the day of your discharge from the hospital (or the next business weekday), please call [facility] to set up or confirm an  appointment to see your surgeon in the office for a follow-up appointment.  Usually it is 2-3 weeks after your surgery.   If you have skin staples at your incision(s), let the office know so we can set up a time in the office for the nurse to remove them (usually around 10 days after surgery). Make sure that you call for appointments the day of discharge (or the next business weekday) from the hospital to ensure a convenient appointment time. IF YOU HAVE DISABILITY OR FAMILY LEAVE FORMS, BRING THEM TO THE OFFICE FOR PROCESSING.  DO NOT GIVE THEM TO YOUR DOCTOR.  [facility] [address] [place], [place], [place]  [address] ? [phone] - Main [phone] -  Toll Free,  [phone] - Fax [email or web address]    GETTING TO GOOD BOWEL HEALTH. It is expected for your digestive tract to need a few months to get back to normal.  It is common for your bowel movements and stools to be irregular.  You will have occasional bloating and cramping that should eventually fade away.  Until you are eating solid food normally, off all pain medications, and back to regular activities; your bowels will not be normal.   Avoiding constipation The goal: ONE SOFT BOWEL MOVEMENT A DAY!    Drink plenty of fluids.  Choose water first. TAKE A FIBER SUPPLEMENT EVERY DAY THE REST OF YOUR LIFE During your first week back home, gradually add back a fiber supplement every day Experiment which form you can tolerate.   There are many forms such as powders, tablets, wafers, gummies, etc Psyllium bran (Metamucil), methylcellulose (Citrucel), Miralax or Glycolax, Benefiber, Flax Seed.  Adjust the dose week-by-week (1/2 dose/day to 6 doses a day) until you are moving your bowels 1-2 times a day.  Cut back the dose or try a different fiber product if it is giving you problems such as diarrhea or bloating. Sometimes a laxative is needed to help jump-start bowels if constipated until the  fiber supplement can help regulate your bowels.  If you are tolerating eating & you are farting, it is okay to try a gentle laxative such as double dose MiraLax, prune juice, or Milk of Magnesia.  Avoid using laxatives too often. Stool softeners can sometimes help counteract the constipating effects of narcotic pain medicines.  It can also cause diarrhea, so avoid using for too long. If you are still constipated despite taking fiber daily, eating solids, and a few doses of laxatives, call our office. Controlling diarrhea Try drinking liquids and eating bland foods for a few days to avoid stressing your intestines further. Avoid dairy products (especially milk & ice cream) for a short time.  The intestines often can lose the ability to digest lactose when stressed. Avoid foods that cause gassiness or bloating.  Typical foods include beans and other legumes, cabbage, broccoli, and dairy foods.  Avoid greasy, spicy, fast foods.  Every person has some sensitivity to other foods, so listen to your body and avoid those foods that trigger problems for you. Probiotics (such as active yogurt, Align, etc) may help repopulate the intestines and colon with normal bacteria and calm down a sensitive digestive tract Adding a fiber supplement gradually can help thicken stools by absorbing excess fluid and retrain the intestines to act more normally.  Slowly increase the dose over a few weeks.  Too much fiber too soon can backfire and cause cramping & bloating. It is okay to try and slow down diarrhea with a few doses of antidiarrheal medicines.   Bismuth subsalicylate (ex. Kayopectate, Pepto Bismol) for a few doses can help control diarrhea.  Avoid if pregnant.   Loperamide (Imodium) can slow down diarrhea.  Start with one tablet ('2mg'$ ) first.  Avoid if you are having fevers or severe pain.  ILEOSTOMY PATIENTS WILL HAVE CHRONIC DIARRHEA since their colon is not in use.    Drink plenty of liquids.  You will need to drink  even more glasses of water/liquid a day to avoid getting dehydrated. Record output from your ileostomy.  Expect to empty the bag every 3-4 hours at first.  Most people with a permanent ileostomy empty their bag 4-6 times at the least.   Use antidiarrheal  medicine (especially Imodium) several times a day to avoid getting dehydrated.  Start with a dose at bedtime & breakfast.  Adjust up or down as needed.  Increase antidiarrheal medications as directed to avoid emptying the bag more than 8 times a day (every 3 hours). Work with your wound ostomy nurse to learn care for your ostomy.  See ostomy care instructions. TROUBLESHOOTING IRREGULAR BOWELS 1) Start with a soft & bland diet. No spicy, greasy, or fried foods.  2) Avoid gluten/wheat or dairy products from diet to see if symptoms improve. 3) Miralax 17gm or flax seed mixed in Flomaton. water or juice-daily. May use 2-4 times a day as needed. 4) Gas-X, Phazyme, etc. as needed for gas & bloating.  5) Prilosec (omeprazole) over-the-counter as needed 6)  Consider probiotics (Align, Activa, etc) to help calm the bowels down  Call your doctor if you are getting worse or not getting better.  Sometimes further testing (cultures, endoscopy, X-ray studies, CT scans, bloodwork, etc.) may be needed to help diagnose and treat the cause of the diarrhea. [facility], [place], [place], [place], [place]  [address] [phone] - Main.    [phone]  - Toll Free.   [phone] - Fax [email or web address]

## 2022-04-26 ENCOUNTER — Other Ambulatory Visit: Payer: Self-pay

## 2022-05-03 ENCOUNTER — Other Ambulatory Visit: Payer: Self-pay

## 2022-05-03 LAB — SURGICAL PATHOLOGY

## 2022-05-03 NOTE — Progress Notes (Signed)
The proposed treatment discussed in conference is for discussion purpose only and is not a binding recommendation.  The patients have not been physically examined, or presented with their treatment options.  Therefore, final treatment plans cannot be decided.  

## 2022-05-11 NOTE — Assessment & Plan Note (Signed)
-  Stage 0 -No indication for treatment -Will continue monitoring.

## 2022-05-11 NOTE — Progress Notes (Signed)
[facility]   Telephone:(336) [phone] Fax:(336) [phone]   Clinic Follow up Note   Patient Care Team: [name], MD as PCP - General (Family Medicine) Gatha Mayer, MD as Consulting Physician (Gastroenterology) Bo Merino, MD as Consulting Physician (Rheumatology) Truitt Merle, MD as Consulting Physician (Oncology) Leighton Ruff, MD as Consulting Physician (General Surgery)  Date of Service:  05/12/2022  CHIEF COMPLAINT: f/u of Adenocarcinoma of the cecum, referred by GI Dr. Silvano Rusk   CURRENT THERAPY:  Surveillance  ASSESSMENT:  Holly Sanders is a 52 y.o. 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 Marcello Moores on April 21, 2022 -I reviewed her surgical pathology findings with her in detail.  She had a stage II disease, without high risk features. -We discussed her risk is low to moderate, adjuvant chemotherapy is not recommended given no high risk features -I recommend checking circulating tumor DNA with GuardantReveal --I discussed the risk of cancer recurrence in the future. I discussed the surveillance plan, which is a physical exam and lab test (including CBC, CMP and CEA) every 3 months for the first 2 years, then every 6-12 months, colonoscopy in one year, and surveilliance CT scan every 6-12 month for up to 5 year.    CLL (chronic lymphocytic leukemia) (HCC) -Stage 0 -No indication for treatment -Will continue monitoring.     PLAN: -Discuss surgical path and risk of cancer recurrence -Tumor marker was Negative -lab reviewed  -Recommend Surveillance, including CEA and GuardantReveal  -referral dietician -colonoscopy in 1 year -lab,genetics 05/29/2022 -f/u in about 3 months     SUMMARY OF ONCOLOGIC HISTORY: Oncology History  Adenocarcinoma of cecum ([place])  03/07/2022 Procedure   Colonoscopy by Dr. Carlean Purl  impression - An infiltrative, polypoid and ulcerated non-obstructing large mass was found in the cecum. The mass was circumferential. This was biopsied with a cold forceps for histology.   03/07/2022 Initial Biopsy   Cecum Biopsy - INVASIVE ADENOCARCINOMA, MODERATELY DIFFERENTIATED (SEE NOTE)   03/07/2022 Tumor Marker   CEA: normal < 2   03/16/2022 Initial Diagnosis   Adenocarcinoma of cecum ([place])   04/21/2022 Cancer Staging   Staging form: Colon and Rectum, AJCC 8th Edition - Pathologic stage from 04/21/2022: Stage IIA (pT3, pN0, cM0) - Signed by Truitt Merle, MD on 05/11/2022 Stage prefix: Initial diagnosis Total positive nodes: 0 Histologic grading system: 4 grade system Histologic grade (G): G2 Residual tumor (R): R0 - None      INTERVAL HISTORY:  Diaz is here for a follow up of  Adenocarcinoma of the cecum She was last seen by NP Lacie on 03/16/2022 She presents to the clinic accompanied by sister. Pt states the surgery went well April 17, 2022. Her appetite is doing well. Pt denies having diarrhea. Pt reports of upper abdominal pain due to surgery. Pt states she had to full periods at the beginning of 2023. Pt states she has never had a regular menstrual cycle.        All other systems were reviewed with the patient and are negative.  MEDICAL HISTORY:  Past Medical History:  Diagnosis Date   Anemia    Cancer ([place])    CLL   Leukocytosis    Lupus erythematosus    Vitamin D deficiency     SURGICAL HISTORY: Past Surgical History:  Procedure Laterality Date   CESAREAN SECTION     COLONOSCOPY  03/07/2022    I have reviewed the social history and family history with the patient and they are unchanged from previous note.  ALLERGIES:  has No Known Allergies.  MEDICATIONS:  Current Outpatient Medications  Medication Sig Dispense Refill   Cholecalciferol (VITAMIN D-3) 125 MCG (5000 UT) TABS Take 1 tablet by mouth daily.     ferrous sulfate 325 (65 FE) MG  tablet Take 325 mg by mouth daily.     traMADol (ULTRAM) 50 MG tablet Take 1-2 tablets (50-100 mg total) by mouth every 6 (six) hours as needed for moderate pain or severe pain. 20 tablet 0   No current facility-administered medications for this visit.    PHYSICAL EXAMINATION: ECOG PERFORMANCE STATUS: 1 - Symptomatic but completely ambulatory  Vitals:   05/12/22 0919  BP: 132/77  Pulse: 96  Resp: 18  Temp: 98 F (36.7 C)  SpO2: 99%   Wt Readings from Last 3 Encounters:  05/12/22 191 lb 9.6 oz (86.9 kg)  04/21/22 195 lb (88.5 kg)  04/14/22 195 lb (88.5 kg)     GENERAL:alert, no distress and comfortable SKIN: skin color normal, no rashes or significant lesions EYES: normal, Conjunctiva are pink and non-injected, sclera clear  NEURO: alert & oriented x 3 with fluent speech  LABORATORY DATA:  I have reviewed the data as listed    Latest Ref Rng & Units 04/23/2022    5:33 AM 04/22/2022    6:26 AM 04/14/2022    1:45 PM  CBC  WBC 4.0 - 10.5 K/uL 17.2  19.1  12.9   Hemoglobin 12.0 - 15.0 g/dL 9.6  9.8  11.2   Hematocrit 36.0 - 46.0 % 30.7  31.3  36.1   Platelets 150 - 400 K/uL 262  261  352         Latest Ref Rng & Units 04/23/2022    5:33 AM 04/22/2022    6:26 AM 03/07/2022    2:42 PM  CMP  Glucose 70 - 99 mg/dL 101  134  88   BUN 6 - 20 mg/dL '7  7  9   '$ Creatinine 0.44 - 1.00 mg/dL 0.78  0.90  0.79   Sodium 135 - 145 mmol/L 136  136  139   Potassium 3.5 - 5.1 mmol/L 3.5  4.1  3.8   Chloride 98 - 111 mmol/L 102  104  103   CO2 22 - 32 mmol/L '24  24  24   '$ Calcium 8.9 - 10.3 mg/dL 8.5  8.5  8.9   Total Protein 6.0 - 8.3 g/dL   7.7   Total Bilirubin 0.2 - 1.2 mg/dL   0.4   Alkaline Phos 39 - 117 U/L   71   AST 0 - 37 U/L   16   ALT 0 - 35 U/L   14       RADIOGRAPHIC STUDIES: I have personally reviewed the radiological images as listed and agreed with the findings in the report. No results found.    Orders Placed This Encounter  Procedures   Guardant 360     Guardantreveal    Standing Status:   Future    Standing Expiration Date:   05/13/2023   Guardant 360    Guardantreveal    Standing Status:   Future    Standing Expiration Date:   05/12/2023   Guardant 360    Guardantreveal    Standing Status:   Future    Standing Expiration Date:   05/12/2023  CEA (IN HOUSE-[facility])    Standing Status:   Standing    Number of Occurrences:   5    Standing Expiration Date:   05/13/2023   CBC with Differential/Platelet    Standing Status:   Standing    Number of Occurrences:   50    Standing Expiration Date:   05/13/2023   Comprehensive metabolic panel    Standing Status:   Standing    Number of Occurrences:   50    Standing Expiration Date:   05/13/2023   Ambulatory Referral to [facility]    Referral Priority:   Routine    Referral Type:   Consultation    Referral Reason:   Specialty Services Required    Number of Visits Requested:   1   All questions were answered. The patient knows to call the clinic with any problems, questions or concerns. No barriers to learning was detected. The total time spent in the appointment was 30 minutes.     Truitt Merle, MD 05/12/2022   Felicity Coyer, CMA, am acting as scribe for Truitt Merle, MD.   I have reviewed the above documentation for accuracy and completeness, and I agree with the above.

## 2022-05-11 NOTE — Assessment & Plan Note (Signed)
pT3N0M0, MSS, G2 -diagnosed in 03/07/2022 through screening colonoscopy -She underwent a right hemicolectomy by Dr. Marcello Moores on April 21, 2022 -I reviewed her surgical pathology findings with her in detail.  She had a stage II disease, without high risk features. -We discussed her risk is low to moderate, adjuvant chemotherapy is not recommended given no high risk features -I recommend checking circulating tumor DNA with GuardantReveal --I discussed the risk of cancer recurrence in the future. I discussed the surveillance plan, which is a physical exam and lab test (including CBC, CMP and CEA) every 3 months for the first 2 years, then every 6-12 months, colonoscopy in one year, and surveilliance CT scan every 6-12 month for up to 5 year.

## 2022-05-12 ENCOUNTER — Other Ambulatory Visit: Payer: Self-pay

## 2022-05-12 ENCOUNTER — Encounter: Payer: Self-pay | Admitting: Hematology

## 2022-05-12 ENCOUNTER — Inpatient Hospital Stay: Payer: 59 | Attending: Nurse Practitioner | Admitting: Hematology

## 2022-05-12 VITALS — BP 132/77 | HR 96 | Temp 98.0°F | Resp 18 | Ht 67.0 in | Wt 191.6 lb

## 2022-05-12 DIAGNOSIS — C9110 Chronic lymphocytic leukemia of B-cell type not having achieved remission: Secondary | ICD-10-CM | POA: Diagnosis not present

## 2022-05-12 DIAGNOSIS — C180 Malignant neoplasm of cecum: Secondary | ICD-10-CM | POA: Insufficient documentation

## 2022-05-16 ENCOUNTER — Inpatient Hospital Stay: Payer: 59 | Admitting: Nutrition

## 2022-05-16 ENCOUNTER — Other Ambulatory Visit: Payer: Self-pay

## 2022-05-16 NOTE — Progress Notes (Signed)
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  She is followed by Dr. Burr Medico.  Her treatment plan is surveillance.  Past medical history includes anemia, CLL, and vitamin D deficiency.  Medications include vitamin D 3, ferrous sulfate.  Labs include glucose 101 and hemoglobin 9.6 on January 14.  Height: 5 feet 7 inches. Weight: 191 pounds 10 ounces. Usual body weight: 207 pounds in May. BMI: 30.  Patient reports she is about 3-1/2 weeks post surgery.  She is slowly adding fiber back into her diet.  She desires survivorship information as well as more information on iron rich foods and anemia.  Nutrition diagnosis: Food and nutrition related knowledge deficit related to cancer as evidenced by no prior need for nutrition related information.  Intervention: Educated patient on the importance of increasing fiber gradually as she continues to heal. Once patient is able, encouraged her to follow a plant-based diet with lean proteins and increased fiber.  Discussed processed meats. Educated on iron rich foods and strategies for enhancing absorption. Reviewed importance of slow safe weight loss especially after surgery to ensure proper healing. Provided specific examples ways she can make healthier food choices. Provided evidence-based resources for patient to continue to educate herself. Provided with multiple facts sheets.  Many questions answered.  Monitoring, evaluation, goals: Patient will tolerate diet advancement to healthy plant-based diet to reduce risk for recurrence and to promote healthy weight.  No follow-up.  If patient requires further assistance with weight loss, patient should be referred to nutrition and diabetes education services.  **Disclaimer: This note was dictated with voice recognition software. Similar sounding words can inadvertently be transcribed and this note may contain transcription errors which  may not have been corrected upon publication of note.**

## 2022-05-17 ENCOUNTER — Other Ambulatory Visit: Payer: 59

## 2022-05-17 ENCOUNTER — Ambulatory Visit: Payer: 59 | Admitting: Internal Medicine

## 2022-05-18 ENCOUNTER — Other Ambulatory Visit: Payer: 59

## 2022-05-18 ENCOUNTER — Ambulatory Visit: Payer: 59 | Admitting: Internal Medicine

## 2022-05-26 ENCOUNTER — Encounter: Payer: Self-pay | Admitting: Genetic Counselor

## 2022-05-29 ENCOUNTER — Inpatient Hospital Stay: Payer: 59

## 2022-05-29 ENCOUNTER — Inpatient Hospital Stay (HOSPITAL_BASED_OUTPATIENT_CLINIC_OR_DEPARTMENT_OTHER): Payer: 59 | Admitting: Genetic Counselor

## 2022-05-29 ENCOUNTER — Other Ambulatory Visit: Payer: Self-pay

## 2022-05-29 DIAGNOSIS — Z800 Family history of malignant neoplasm of digestive organs: Secondary | ICD-10-CM

## 2022-05-29 DIAGNOSIS — C180 Malignant neoplasm of cecum: Secondary | ICD-10-CM

## 2022-05-29 DIAGNOSIS — Z803 Family history of malignant neoplasm of breast: Secondary | ICD-10-CM

## 2022-05-29 DIAGNOSIS — C9110 Chronic lymphocytic leukemia of B-cell type not having achieved remission: Secondary | ICD-10-CM | POA: Diagnosis not present

## 2022-05-29 LAB — CBC WITH DIFFERENTIAL/PLATELET
Abs Immature Granulocytes: 0.02 10*3/uL (ref 0.00–0.07)
Basophils Absolute: 0.1 10*3/uL (ref 0.0–0.1)
Basophils Relative: 0 %
Eosinophils Absolute: 0.1 10*3/uL (ref 0.0–0.5)
Eosinophils Relative: 1 %
HCT: 33.8 % — ABNORMAL LOW (ref 36.0–46.0)
Hemoglobin: 11.2 g/dL — ABNORMAL LOW (ref 12.0–15.0)
Immature Granulocytes: 0 %
Lymphocytes Relative: 57 %
Lymphs Abs: 6.4 10*3/uL — ABNORMAL HIGH (ref 0.7–4.0)
MCH: 29 pg (ref 26.0–34.0)
MCHC: 33.1 g/dL (ref 30.0–36.0)
MCV: 87.6 fL (ref 80.0–100.0)
Monocytes Absolute: 0.7 10*3/uL (ref 0.1–1.0)
Monocytes Relative: 6 %
Neutro Abs: 4.1 10*3/uL (ref 1.7–7.7)
Neutrophils Relative %: 36 %
Platelets: 274 10*3/uL (ref 150–400)
RBC: 3.86 MIL/uL — ABNORMAL LOW (ref 3.87–5.11)
RDW: 16.3 % — ABNORMAL HIGH (ref 11.5–15.5)
WBC: 11.3 10*3/uL — ABNORMAL HIGH (ref 4.0–10.5)
nRBC: 0 % (ref 0.0–0.2)

## 2022-05-29 LAB — COMPREHENSIVE METABOLIC PANEL
ALT: 12 U/L (ref 0–44)
AST: 13 U/L — ABNORMAL LOW (ref 15–41)
Albumin: 4 g/dL (ref 3.5–5.0)
Alkaline Phosphatase: 62 U/L (ref 38–126)
Anion gap: 5 (ref 5–15)
BUN: 16 mg/dL (ref 6–20)
CO2: 29 mmol/L (ref 22–32)
Calcium: 9 mg/dL (ref 8.9–10.3)
Chloride: 105 mmol/L (ref 98–111)
Creatinine, Ser: 0.77 mg/dL (ref 0.44–1.00)
GFR, Estimated: >60 mL/min (ref >60)
Glucose, Bld: 108 mg/dL — ABNORMAL HIGH (ref 70–99)
Potassium: 4.2 mmol/L (ref 3.5–5.1)
Sodium: 139 mmol/L (ref 135–145)
Total Bilirubin: 0.3 mg/dL (ref 0.3–1.2)
Total Protein: 6.8 g/dL (ref 6.5–8.1)

## 2022-05-29 LAB — CEA (IN HOUSE-CHCC): CEA (CHCC-In House): <1 ng/mL (ref 0.00–5.00)

## 2022-05-29 NOTE — Progress Notes (Unsigned)
REFERRING PROVIDER: Truitt Merle, MD [address] [place],  [place] [address]   PRIMARY PROVIDER:  [name], MD  PRIMARY REASON FOR VISIT:  No diagnosis found.   HISTORY OF PRESENT ILLNESS:   Holly Sanders, a 52 y.o. female, was seen for a [facility] cancer genetics consultation at the request of Dr. Burr Medico due to a personal and family history of cancer.  Holly Sanders presents to clinic today to discuss the possibility of a hereditary predisposition to cancer, to discuss genetic testing, and to further clarify her future cancer risks, as well as potential cancer risks for family members.   In February 2023, at the age of ***, Holly Sanders was diagnosed with {CA PATHOLOGY:63853} of the {right left (wildcard):15202} {CA QZ:6220857. The treatment plan ***.    *** Holly Sanders is a 52 y.o. female with no personal history of cancer.    CANCER HISTORY:  Oncology History  Adenocarcinoma of cecum ([place])  03/07/2022 Procedure   Colonoscopy by Dr. Carlean Purl impression - An infiltrative, polypoid and ulcerated non-obstructing large mass was found in the cecum. The mass was circumferential. This was biopsied with a cold forceps for histology.   03/07/2022 Initial Biopsy   Cecum Biopsy - INVASIVE ADENOCARCINOMA, MODERATELY DIFFERENTIATED (SEE NOTE)   03/07/2022 Tumor Marker   CEA: normal < 2   03/16/2022 Initial Diagnosis   Adenocarcinoma of cecum ([place])   04/21/2022 Cancer Staging   Staging form: Colon and Rectum, AJCC 8th Edition - Pathologic stage from 04/21/2022: Stage IIA (pT3, pN0, cM0) - Signed by Truitt Merle, MD on 05/11/2022 Stage prefix: Initial diagnosis Total positive nodes: 0 Histologic grading system: 4 grade system Histologic grade (G): G2 Residual tumor (R): R0 - None      RISK FACTORS:  Mammogram within the last year: *** Number of breast biopsies: {Numbers 1-12 multi-select:20307}. Colonoscopy: {Yes/No-Ex:120004}; {normal/abnormal/not  examined:14677}. Hysterectomy: {Yes/No-Ex:120004}.  Ovaries intact: {Yes/No-Ex:120004}.  Up to date with pelvic exams: {Yes/No-Ex:120004}. Menarche was at age ***.  First live birth at age ***.  Menopausal status: {Menopause:31378}.  OCP use for approximately {Numbers 1-12 multi-select:20307} years.  HRT use: {Numbers 1-12 multi-select:20307} years. Any excessive radiation exposure in the past: {Yes/No-Ex:120004}  Past Medical History:  Diagnosis Date   Anemia    Cancer ([place])    CLL   Leukocytosis    Lupus erythematosus    Vitamin D deficiency     Past Surgical History:  Procedure Laterality Date   CESAREAN SECTION     COLONOSCOPY  03/07/2022    FAMILY HISTORY:  We obtained a detailed, 4-generation family history.  Significant diagnoses are listed below: Family History  Problem Relation Age of Onset   Heart disease Mother    Lymphoma Father    Squamous cell carcinoma Father    Basal cell carcinoma Father    Heart disease Brother    Colon polyps Paternal Grandmother    Colon cancer Paternal Grandmother    Esophageal cancer Paternal Grandfather    Breast cancer Paternal Aunt    Breast cancer Cousin    Rectal cancer Neg Hx    Stomach cancer Neg Hx     Holly Sanders is {aware/unaware} of previous family history of genetic testing for hereditary cancer risks. Patient's maternal ancestors are of *** descent, and paternal ancestors are of *** descent. There {IS NO:12509} reported Ashkenazi Jewish ancestry. There {IS NO:12509} known consanguinity.  GENETIC COUNSELING ASSESSMENT: Holly Sanders is a 52 y.o. female with a {Personal/family:20331} history of {cancer/polyps} which  is somewhat suggestive of a {DISEASE} and predisposition to cancer given ***. We, therefore, discussed and recommended the following at today's visit.   DISCUSSION: We discussed that *** - ***% of *** is hereditary.  Most cases of *** associated with ***.  There are other genes that can be associated with  hereditary *** cancer syndromes.  These include ***.  We discussed that testing is beneficial for several reasons including knowing how to follow individuals for their cancer risks, identifying whether potential treatment options *** would be beneficial, and understanding if other family members could be at risk for cancer and allowing them to undergo genetic testing.   We reviewed the characteristics, features and inheritance patterns of hereditary cancer syndromes. We also discussed genetic testing, including the appropriate family members to test, the process of testing, insurance coverage and turn-around-time for results. We discussed the implications of a negative, positive, carrier and/or variant of uncertain significant result. We recommended Holly Sanders pursue genetic testing for a panel that includes genes associated with *** cancer.   Holly Sanders  was offered a common hereditary cancer panel (47 genes) and an expanded pan-cancer panel (77 genes). Holly Sanders was informed of the benefits and limitations of each panel, including that expanded pan-cancer panels contain genes that do not have clear management guidelines at this point in time.  We also discussed that as the number of genes included on a panel increases, the chances of variants of uncertain significance increases.  After considering the benefits and limitations of each gene panel, Holly Sanders  elected to have *** through ***.   Based on Holly Sanders's {Personal/family:20331} history of cancer, she meets medical criteria for genetic testing. Despite that she meets criteria, she may still have an out of pocket cost. We discussed that if her out of pocket cost for testing is over $100, the laboratory should contact her and discuss the self-pay prices and/or patient pay assistance programs.    ***We reviewed the characteristics, features and inheritance patterns of hereditary cancer syndromes. We also discussed genetic testing, including the  appropriate family members to test, the process of testing, insurance coverage and turn-around-time for results. We discussed the implications of a negative, positive and/or variant of uncertain significant result. In order to get genetic test results in a timely manner so that Holly Sanders can use these genetic test results for surgical decisions, we recommended Holly Sanders pursue genetic testing for the ***. Once complete, we recommend Holly Sanders pursue reflex genetic testing to the *** gene panel.   Based on Holly Sanders's {Personal/family:20331} history of cancer, she meets medical criteria for genetic testing. Despite that she meets criteria, she may still have an out of pocket cost.   ***We discussed with Holly Sanders that the {Personal/family:20331} history does not meet insurance or NCCN criteria for genetic testing and, therefore, is not highly consistent with a familial hereditary cancer syndrome.  We feel she is at low risk to harbor a gene mutation associated with such a condition. Thus, we did not recommend any genetic testing, at this time, and recommended Holly Sanders continue to follow the cancer screening guidelines given by her primary healthcare provider.  ***In order to estimate her chance of having a {CA GENE:62345} mutation, we used statistical models ({GENMODELS:62370}) that consider her personal medical history, family history and ancestry.  Because each model is different, there can be a lot of variability in the risks they give.  Therefore, these numbers must be considered a rough range  and not a precise risk of having a {CA GENE:62345} mutation.  These models estimate that she has approximately a ***-***% chance of having a mutation. Based on this assessment of her family and personal history, genetic testing {IS/ISNOT:34056} recommended.  ***Based on the patient's {Personal/family:20331} history, a statistical model ({GENMODELS:62370}) was used to estimate her risk of developing {CA  HX:54794}. This estimates her lifetime risk of developing {CA HX:54794} to be approximately ***%. This estimation does not consider any genetic testing results.  The patient's lifetime breast cancer risk is a preliminary estimate based on available information using one of several models endorsed by the [place] (ACS). The ACS recommends consideration of breast MRI screening as an adjunct to mammography for patients at high risk (defined as 20% or greater lifetime risk).   ***Holly Sanders has been determined to be at high risk for breast cancer.  Therefore, we recommend that annual screening with mammography and breast MRI be performed.  ***begin at age 29, or 10 years prior to the age of breast cancer diagnosis in a relative (whichever is earlier).  We discussed that Ms. Leahey should discuss her individual situation with her referring physician and determine a breast cancer screening plan with which they are both comfortable.    PLAN: After considering the risks, benefits, and limitations, Ms. Lyerly provided informed consent to pursue genetic testing and the blood sample was sent to {Lab} Laboratories for analysis of the {test}. Results should be available within approximately {TAT TIME} weeks' time, at which point they will be disclosed by telephone to Ms. Densmore, as will any additional recommendations warranted by these results. Ms. Sandino will receive a summary of her genetic counseling visit and a copy of her results once available. This information will also be available in Epic.   *** Despite our recommendation, Ms. Litzenberger did not wish to pursue genetic testing at today's visit. We understand this decision and remain available to coordinate genetic testing at any time in the future. We, therefore, recommend Ms. Mcferrin continue to follow the cancer screening guidelines given by her primary healthcare provider.  ***Based on Ms. Bruss's family history, we recommended her ***, who  was diagnosed with *** at age ***, have genetic counseling and testing. Ms. Pizzolato will let us know if we can be of any assistance in coordinating genetic counseling and/or testing for this family member.   Lastly, we encouraged Ms. Fedor to remain in contact with cancer genetics annually so that we can continuously update the family history and inform her of any changes in cancer genetics and testing that may be of benefit for this family.   Ms. Borck questions were answered to her satisfaction today. Our contact information was provided should additional questions or concerns arise. Thank you for the referral and allowing [place] to share in the care of your patient.   Brinna Divelbiss M. Joette Catching, [place], [facility] Genetic Counselor [email or web address] (P) [phone]  The patient was seen for a total of *** minutes in face-to-face genetic counseling.  ***The was accompanied by ***.  ***The patient was seen alone.  Drs. Lindi Adie and/or Burr Medico were available to discuss this case as needed.    _______________________________________________________________________ For Office Staff:  Number of people involved in session: *** Was an Intern/ student involved with case: {YES/NO:63}

## 2022-05-30 ENCOUNTER — Encounter: Payer: Self-pay | Admitting: Genetic Counselor

## 2022-05-30 ENCOUNTER — Encounter: Payer: Self-pay | Admitting: Nurse Practitioner

## 2022-06-02 ENCOUNTER — Telehealth: Payer: Self-pay | Admitting: Genetic Counselor

## 2022-06-02 NOTE — Telephone Encounter (Signed)
Called to discuss skin punch biopsy.  Waiting to hear back from [facility] on if they would be willing to accept new patient for skin punch.  Will also check with [facility] if needed.

## 2022-06-07 ENCOUNTER — Other Ambulatory Visit: Payer: Self-pay

## 2022-06-07 ENCOUNTER — Other Ambulatory Visit: Payer: Self-pay | Admitting: Family Medicine

## 2022-06-07 ENCOUNTER — Telehealth: Payer: Self-pay | Admitting: Genetic Counselor

## 2022-06-07 DIAGNOSIS — Z1231 Encounter for screening mammogram for malignant neoplasm of breast: Secondary | ICD-10-CM

## 2022-06-07 NOTE — Telephone Encounter (Signed)
Patient gave permission to submit referral for skin punch procedure for genetic testing sample collection to [facility] (Dr. Anabel Bene).  Referral faxed to office.

## 2022-06-08 ENCOUNTER — Encounter: Payer: Self-pay | Admitting: Hematology

## 2022-06-09 ENCOUNTER — Ambulatory Visit
Admission: RE | Admit: 2022-06-09 | Discharge: 2022-06-09 | Disposition: A | Discharge status: Home / Self Care | Payer: 59 | Source: Ambulatory Visit | Attending: Family Medicine | Admitting: Family Medicine

## 2022-06-09 DIAGNOSIS — Z1231 Encounter for screening mammogram for malignant neoplasm of breast: Secondary | ICD-10-CM

## 2022-06-09 LAB — GUARDANT 360

## 2022-06-23 ENCOUNTER — Telehealth: Payer: Self-pay | Admitting: Hematology

## 2022-06-23 ENCOUNTER — Other Ambulatory Visit: Payer: Self-pay

## 2022-06-23 NOTE — Telephone Encounter (Signed)
Per 3/15 IB reached out to patient, patient requested to move lab to later in the afternoon, patient aware of date and time of appointment.

## 2022-06-27 ENCOUNTER — Inpatient Hospital Stay: Payer: 59 | Attending: Nurse Practitioner

## 2022-06-27 ENCOUNTER — Inpatient Hospital Stay: Payer: 59

## 2022-06-27 DIAGNOSIS — C180 Malignant neoplasm of cecum: Secondary | ICD-10-CM | POA: Insufficient documentation

## 2022-06-27 DIAGNOSIS — C9110 Chronic lymphocytic leukemia of B-cell type not having achieved remission: Secondary | ICD-10-CM | POA: Diagnosis not present

## 2022-06-27 LAB — CBC WITH DIFFERENTIAL/PLATELET
Abs Immature Granulocytes: 0.03 10*3/uL (ref 0.00–0.07)
Basophils Absolute: 0.1 10*3/uL (ref 0.0–0.1)
Basophils Relative: 0 %
Eosinophils Absolute: 0.1 10*3/uL (ref 0.0–0.5)
Eosinophils Relative: 1 %
HCT: 36.9 % (ref 36.0–46.0)
Hemoglobin: 12.1 g/dL (ref 12.0–15.0)
Immature Granulocytes: 0 %
Lymphocytes Relative: 58 %
Lymphs Abs: 7.3 10*3/uL — ABNORMAL HIGH (ref 0.7–4.0)
MCH: 28.9 pg (ref 26.0–34.0)
MCHC: 32.8 g/dL (ref 30.0–36.0)
MCV: 88.3 fL (ref 80.0–100.0)
Monocytes Absolute: 0.9 10*3/uL (ref 0.1–1.0)
Monocytes Relative: 7 %
Neutro Abs: 4.4 10*3/uL (ref 1.7–7.7)
Neutrophils Relative %: 34 %
Platelets: 271 10*3/uL (ref 150–400)
RBC: 4.18 MIL/uL (ref 3.87–5.11)
RDW: 15.3 % (ref 11.5–15.5)
WBC: 12.8 10*3/uL — ABNORMAL HIGH (ref 4.0–10.5)
nRBC: 0 % (ref 0.0–0.2)

## 2022-06-27 LAB — COMPREHENSIVE METABOLIC PANEL
ALT: 15 U/L (ref 0–44)
AST: 15 U/L (ref 15–41)
Albumin: 4.4 g/dL (ref 3.5–5.0)
Alkaline Phosphatase: 74 U/L (ref 38–126)
Anion gap: 7 (ref 5–15)
BUN: 16 mg/dL (ref 6–20)
CO2: 29 mmol/L (ref 22–32)
Calcium: 9.9 mg/dL (ref 8.9–10.3)
Chloride: 104 mmol/L (ref 98–111)
Creatinine, Ser: 0.87 mg/dL (ref 0.44–1.00)
GFR, Estimated: >60 mL/min (ref >60)
Glucose, Bld: 92 mg/dL (ref 70–99)
Potassium: 4.3 mmol/L (ref 3.5–5.1)
Sodium: 140 mmol/L (ref 135–145)
Total Bilirubin: 0.3 mg/dL (ref 0.3–1.2)
Total Protein: 7.4 g/dL (ref 6.5–8.1)

## 2022-06-30 ENCOUNTER — Telehealth: Payer: Self-pay | Admitting: Genetic Counselor

## 2022-06-30 LAB — CEA (IN HOUSE-CHCC): CEA (CHCC-In House): <1 ng/mL (ref 0.00–5.00)

## 2022-06-30 NOTE — Telephone Encounter (Signed)
Received confirmation from [facility] that patient scheduled for skin punch on 4/4.  Kit requested from [facility] to be sent to [facility].  Secure email sent to [facility] from office with further information and TRF.

## 2022-07-10 LAB — GUARDANT 360

## 2022-07-12 ENCOUNTER — Other Ambulatory Visit: Payer: Self-pay

## 2022-07-13 ENCOUNTER — Encounter: Payer: Self-pay | Admitting: Hematology

## 2022-08-09 ENCOUNTER — Other Ambulatory Visit: Payer: Self-pay

## 2022-08-09 DIAGNOSIS — C180 Malignant neoplasm of cecum: Secondary | ICD-10-CM

## 2022-08-14 ENCOUNTER — Inpatient Hospital Stay: Payer: 59 | Attending: Nurse Practitioner

## 2022-08-14 ENCOUNTER — Other Ambulatory Visit: Payer: Self-pay

## 2022-08-14 DIAGNOSIS — C9110 Chronic lymphocytic leukemia of B-cell type not having achieved remission: Secondary | ICD-10-CM | POA: Insufficient documentation

## 2022-08-14 DIAGNOSIS — C180 Malignant neoplasm of cecum: Secondary | ICD-10-CM

## 2022-08-14 LAB — COMPREHENSIVE METABOLIC PANEL
ALT: 13 U/L (ref 0–44)
AST: 14 U/L — ABNORMAL LOW (ref 15–41)
Albumin: 4.2 g/dL (ref 3.5–5.0)
Alkaline Phosphatase: 57 U/L (ref 38–126)
Anion gap: 6 (ref 5–15)
BUN: 12 mg/dL (ref 6–20)
CO2: 28 mmol/L (ref 22–32)
Calcium: 9.2 mg/dL (ref 8.9–10.3)
Chloride: 106 mmol/L (ref 98–111)
Creatinine, Ser: 0.85 mg/dL (ref 0.44–1.00)
GFR, Estimated: >60 mL/min (ref >60)
Glucose, Bld: 89 mg/dL (ref 70–99)
Potassium: 4 mmol/L (ref 3.5–5.1)
Sodium: 140 mmol/L (ref 135–145)
Total Bilirubin: 0.5 mg/dL (ref 0.3–1.2)
Total Protein: 7.3 g/dL (ref 6.5–8.1)

## 2022-08-14 LAB — CBC WITH DIFFERENTIAL/PLATELET
Abs Immature Granulocytes: 0.02 10*3/uL (ref 0.00–0.07)
Basophils Absolute: 0 10*3/uL (ref 0.0–0.1)
Basophils Relative: 0 %
Eosinophils Absolute: 0.1 10*3/uL (ref 0.0–0.5)
Eosinophils Relative: 1 %
HCT: 38 % (ref 36.0–46.0)
Hemoglobin: 12.6 g/dL (ref 12.0–15.0)
Immature Granulocytes: 0 %
Lymphocytes Relative: 49 %
Lymphs Abs: 4.8 10*3/uL — ABNORMAL HIGH (ref 0.7–4.0)
MCH: 29.7 pg (ref 26.0–34.0)
MCHC: 33.2 g/dL (ref 30.0–36.0)
MCV: 89.6 fL (ref 80.0–100.0)
Monocytes Absolute: 1 10*3/uL (ref 0.1–1.0)
Monocytes Relative: 10 %
Neutro Abs: 4 10*3/uL (ref 1.7–7.7)
Neutrophils Relative %: 40 %
Platelets: 234 10*3/uL (ref 150–400)
RBC: 4.24 MIL/uL (ref 3.87–5.11)
RDW: 13.9 % (ref 11.5–15.5)
Smear Review: NORMAL
WBC: 10.1 10*3/uL (ref 4.0–10.5)
nRBC: 0 % (ref 0.0–0.2)

## 2022-08-14 LAB — CEA (IN HOUSE-CHCC): CEA (CHCC-In House): <1 ng/mL (ref 0.00–5.00)

## 2022-08-15 ENCOUNTER — Ambulatory Visit: Payer: Self-pay | Admitting: Genetic Counselor

## 2022-08-15 ENCOUNTER — Telehealth: Payer: Self-pay | Admitting: Genetic Counselor

## 2022-08-15 ENCOUNTER — Encounter: Payer: Self-pay | Admitting: Genetic Counselor

## 2022-08-15 DIAGNOSIS — C180 Malignant neoplasm of cecum: Secondary | ICD-10-CM

## 2022-08-15 DIAGNOSIS — Z1379 Encounter for other screening for genetic and chromosomal anomalies: Secondary | ICD-10-CM

## 2022-08-15 DIAGNOSIS — C9110 Chronic lymphocytic leukemia of B-cell type not having achieved remission: Secondary | ICD-10-CM

## 2022-08-15 NOTE — Progress Notes (Signed)
HPI:   Holly Sanders was previously seen in the [facility] due to a personal history of colon cancer and concerns regarding a hereditary predisposition to cancer.    Holly Sanders recent genetic test results were disclosed to her by telephone. These results and recommendations are discussed in more detail below.  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  In November 2023, she was diagnosed with adenocarcinoma of the cecum (MSI stable and MMR proteins intact).    Oncology History  Adenocarcinoma of cecum ([facility])  03/07/2022 Procedure   Colonoscopy by Dr. Leone Payor impression - An infiltrative, polypoid and ulcerated non-obstructing large mass was found in the cecum. The mass was circumferential. This was biopsied with a cold forceps for histology.   03/07/2022 Initial Biopsy   Cecum Biopsy - INVASIVE ADENOCARCINOMA, MODERATELY DIFFERENTIATED (SEE NOTE)   03/07/2022 Tumor Marker   CEA: normal < 2   03/16/2022 Initial Diagnosis   Adenocarcinoma of cecum ([facility])   04/21/2022 Cancer Staging   Staging form: Colon and Rectum, AJCC 8th Edition - Pathologic stage from 04/21/2022: Stage IIA (pT3, pN0, cM0) - Signed by Malachy Mood, MD on 05/11/2022 Stage prefix: Initial diagnosis Total positive nodes: 0 Histologic grading system: 4 grade system Histologic grade (G): G2 Residual tumor (R): R0 - None   08/10/2022 Genetic Testing   Negative [facility] CancerNext-Expanded.  Report date is 08/10/2022.   The CancerNext-Expanded gene panel offered by [facility] and includes sequencing and rearrangement for the following 77 genes: AIP, ALK, APC, ATM, AXIN2, BAP1, BARD1, BLM, BMPR1A, BRCA1, BRCA2, BRIP1, CDC73, CDH1, CDK4, CDKN1B, CDKN2A, CHEK2, CTNNA1, DICER1, FANCC, FH, FLCN, GALNT12, KIF1B, LZTR1, MAX, MEN1, MET, MLH1, MSH2, MSH3, MSH6, MUTYH, NBN, NF1, NF2, NTHL1, PALB2, PHOX2B, PMS2, POT1, PRKAR1A, PTCH1, PTEN, RAD51C,  RAD51D, RB1, RECQL, RET, SDHA, SDHAF2, SDHB, SDHC, SDHD, SMAD4, SMARCA4, SMARCB1, SMARCE1, STK11, SUFU, TMEM127, TP53, TSC1, TSC2, VHL and XRCC2 (sequencing and deletion/duplication); EGFR, EGLN1, HOXB13, KIT, MITF, PDGFRA, POLD1, and POLE (sequencing only); EPCAM and GREM1 (deletion/duplication only).      FAMILY HISTORY:  We obtained a detailed, 4-generation family history.  Significant diagnoses are listed below:      Family History  Problem Relation Age of Onset   Lymphoma Father          dx 25s; recurrence in 26s   Squamous cell carcinoma Father     Breast cancer Paternal Aunt          dx > 50   Colon cancer Paternal Grandmother          or stomach cancer?; dx after 50   Colon polyps Paternal Grandmother     Breast cancer Paternal Grandmother          dx after 25   Esophageal cancer Paternal Grandfather     Breast cancer Cousin          paternal female cousin; dx 68s       Holly Sanders reported that her unaffected paternal first cousin had negative genetic testing.  No report was available for review today. Holly Sanders is unaware of other previous family history of genetic testing for hereditary cancer risks. She has limited information and contact with maternal relatives.  Other relatives are unavailable for genetic testing at this time.    Paternal ancestors are of [place] descent and she reports possible Ashkenazi Jewish ancestry in her paternal relatives.  There is no known consanguinity.  GENETIC TEST RESULTS:  The [facility] CancerNext-Expanded Panel found no pathogenic mutations. The CancerNext-Expanded gene panel offered by [facility] and includes sequencing and rearrangement for the following 77 genes: AIP, ALK, APC, ATM, AXIN2, BAP1, BARD1, BLM, BMPR1A, BRCA1, BRCA2, BRIP1, CDC73, CDH1, CDK4, CDKN1B, CDKN2A, CHEK2, CTNNA1, DICER1, FANCC, FH, FLCN, GALNT12, KIF1B, LZTR1, MAX, MEN1, MET, MLH1, MSH2, MSH3, MSH6, MUTYH, NBN, NF1, NF2, NTHL1, PALB2, PHOX2B, PMS2, POT1,  PRKAR1A, PTCH1, PTEN, RAD51C, RAD51D, RB1, RECQL, RET, SDHA, SDHAF2, SDHB, SDHC, SDHD, SMAD4, SMARCA4, SMARCB1, SMARCE1, STK11, SUFU, TMEM127, TP53, TSC1, TSC2, VHL and XRCC2 (sequencing and deletion/duplication); EGFR, EGLN1, HOXB13, KIT, MITF, PDGFRA, POLD1, and POLE (sequencing only); EPCAM and GREM1 (deletion/duplication only). .   The test report has been scanned into EPIC and is located under the Molecular Pathology section of the Results Review tab.  A portion of the result report is included below for reference. Genetic testing reported out on Aug 10, 2022.       Even though a pathogenic variant was not identified, possible explanations for the cancer in the family may include: There may be no hereditary risk for cancer in the family. The cancers in Holly Sanders and/or her family may be sporadic/familial or due to other genetic and environmental factors. There may be a gene mutation in one of these genes that current testing methods cannot detect but that chance is small. There could be another gene that has not yet been discovered, or that we have not yet tested, that is responsible for the cancer diagnoses in the family.  It is also possible there is a hereditary cause for the cancer in the family that Holly Sanders did not inherit.   Therefore, it is important to remain in touch with cancer genetics in the future so that we can continue to offer Holly Sanders the most up to date genetic testing.     ADDITIONAL GENETIC TESTING:   Holly Sanders genetic testing was fairly extensive.  If there are additional relevant genes identified to increase cancer risk that can be analyzed in the future, we would be happy to discuss and coordinate this testing at that time.     CANCER SCREENING RECOMMENDATIONS:  Holly Sanders test result is considered negative (normal).  This means that we have not identified a hereditary cause for her personal history of colon cancer or CLL at this time.   An  individual's cancer risk and medical management are not determined by genetic test results alone. Overall cancer risk assessment incorporates additional factors, including personal medical history, family history, and any available genetic information that may result in a personalized plan for cancer prevention and surveillance. Therefore, it is recommended she continue to follow the cancer management and screening guidelines provided by her oncology and primary healthcare provider.  RECOMMENDATIONS FOR FAMILY MEMBERS:   Since she did not inherit a identifiable mutation in a cancer predisposition gene included on this panel, her children could not have inherited a known mutation from her in one of these genes. Individuals in this family might be at some increased risk of developing cancer, over the general population risk, due to the family history of cancer.  Individuals in the family should notify their providers of the family history of cancer. We recommend women in this family have a yearly mammogram beginning at age 77, or 70 years younger than the earliest onset of cancer, an annual clinical breast exam, and perform monthly breast self-exams.  Risk models that take into account family history  and hormonal history may be helpful in determining appropriate breast cancer screening options for family members.   First degree relatives of those with colon cancer should receive colonoscopies beginning at age 35, or 10 years prior to the earliest diagnosis of colon cancer in the family, and receive colonoscopies at least every 5 years, or as recommended by their gastroenterologist.   Other members of the family may still carry a pathogenic variant in one of these genes that Holly Sanders did not inherit. Based on the family history, we recommend her paternal cousin, who was diagnosed with breast cancer in her 61s, have genetic counseling and testing. Holly Sanders can let us know if we can be of any assistance in  coordinating genetic counseling and/or testing for these family member.     FOLLOW-UP:  Cancer genetics is a rapidly advancing field and it is possible that new genetic tests will be appropriate for her and/or her family members in the future. We encourage Ms. Watkin to remain in contact with cancer genetics, so we can update her personal and family histories and let her know of advances in cancer genetics that may benefit this family.   Our contact number was provided.. she knows she is welcome to call us at anytime with additional questions or concerns.   Garvis Downum M. Rennie Plowman, MS, [facility] Genetic Counselor [email or web address] (P) [phone]

## 2022-08-15 NOTE — Telephone Encounter (Signed)
Disclosed negative genetic testing   

## 2022-08-22 ENCOUNTER — Encounter: Payer: Self-pay | Admitting: Hematology

## 2022-08-25 LAB — GUARDANT REVEAL

## 2022-09-06 ENCOUNTER — Inpatient Hospital Stay: Payer: 59 | Admitting: Hematology

## 2022-09-06 ENCOUNTER — Other Ambulatory Visit: Payer: Self-pay

## 2022-09-06 ENCOUNTER — Encounter: Payer: Self-pay | Admitting: Hematology

## 2022-09-06 VITALS — BP 116/63 | HR 84 | Temp 98.4°F | Resp 18 | Ht 67.0 in | Wt 194.5 lb

## 2022-09-06 DIAGNOSIS — C180 Malignant neoplasm of cecum: Secondary | ICD-10-CM

## 2022-09-06 DIAGNOSIS — C9110 Chronic lymphocytic leukemia of B-cell type not having achieved remission: Secondary | ICD-10-CM | POA: Diagnosis not present

## 2022-09-06 NOTE — Assessment & Plan Note (Signed)
pT3N0M0, MSS, G2 -diagnosed in 03/07/2022 through screening colonoscopy -She underwent a right hemicolectomy by Dr. Maisie Fus on April 21, 2022 -I reviewed her surgical pathology findings with her in detail.  She had a stage II disease, without high risk features. -We discussed her risk is low to moderate, adjuvant chemotherapy is not recommended given no high risk features -I recommend checking circulating tumor DNA with GuardantReveal which has been negative  -plan to repeat CT in 03/2023

## 2022-09-06 NOTE — Progress Notes (Signed)
[facility]   Telephone:(336) [phone] Fax:(336) [phone]   Clinic Follow up Note   Patient Care Team: Lewis Moccasin, MD as PCP - General (Family Medicine) Iva Boop, MD as Consulting Physician (Gastroenterology) Pollyann Savoy, MD as Consulting Physician (Rheumatology) Malachy Mood, MD as Consulting Physician (Oncology) Romie Levee, MD as Consulting Physician (General Surgery)  Date of Service:  09/06/2022  CHIEF COMPLAINT: f/u of Adenocarcinoma of the cecum   CURRENT THERAPY:  Surveillance   ASSESSMENT:  Holly Sanders is a 52 y.o. female with   Adenocarcinoma of cecum (HCC) pT3N0M0, MSS, G2 -diagnosed in 03/07/2022 through screening colonoscopy -She underwent a right hemicolectomy by Dr. Maisie Fus on April 21, 2022 -I reviewed her surgical pathology findings with her in detail.  She had a stage II disease, without high risk features. -We discussed her risk is low to moderate, adjuvant chemotherapy is not recommended given no high risk features -I recommend checking circulating tumor DNA with GuardantReveal which has been negative  -plan to repeat CT in 03/2023 -She is clinically doing well, lab reviewed, exam unremarkable, there is no concern for recurrence  CLL --Stage 0 -No indication for treatment -Will continue monitoring.  -lab reviewed, ALC was 4.8K on recent lab, trending down   PLAN: -lab reviewed -Guardant 360- negative -repeat CT scan 03/2023 -lab and f/u in 4 months  SUMMARY OF ONCOLOGIC HISTORY: Oncology History Overview Note   Cancer Staging  Adenocarcinoma of cecum (HCC) Staging form: Colon and Rectum, AJCC 8th Edition - Pathologic stage from 04/21/2022: Stage IIA (pT3, pN0, cM0) - Signed by Malachy Mood, MD on 05/11/2022 Stage prefix: Initial diagnosis Total positive nodes: 0 Histologic grading system: 4 grade system Histologic grade (G): G2 Residual tumor (R): R0 - None     Adenocarcinoma of cecum (HCC)  03/07/2022  Procedure   Colonoscopy by Dr. Leone Payor impression - An infiltrative, polypoid and ulcerated non-obstructing large mass was found in the cecum. The mass was circumferential. This was biopsied with a cold forceps for histology.   03/07/2022 Initial Biopsy   Cecum Biopsy - INVASIVE ADENOCARCINOMA, MODERATELY DIFFERENTIATED (SEE NOTE)   03/07/2022 Tumor Marker   CEA: normal < 2   03/16/2022 Initial Diagnosis   Adenocarcinoma of cecum (HCC)   04/21/2022 Cancer Staging   Staging form: Colon and Rectum, AJCC 8th Edition - Pathologic stage from 04/21/2022: Stage IIA (pT3, pN0, cM0) - Signed by Malachy Mood, MD on 05/11/2022 Stage prefix: Initial diagnosis Total positive nodes: 0 Histologic grading system: 4 grade system Histologic grade (G): G2 Residual tumor (R): R0 - None   08/10/2022 Genetic Testing   Negative [facility] CancerNext-Expanded.  Report date is 08/10/2022.   The CancerNext-Expanded gene panel offered by [facility] and includes sequencing and rearrangement for the following 77 genes: AIP, ALK, APC, ATM, AXIN2, BAP1, BARD1, BLM, BMPR1A, BRCA1, BRCA2, BRIP1, CDC73, CDH1, CDK4, CDKN1B, CDKN2A, CHEK2, CTNNA1, DICER1, FANCC, FH, FLCN, GALNT12, KIF1B, LZTR1, MAX, MEN1, MET, MLH1, MSH2, MSH3, MSH6, MUTYH, NBN, NF1, NF2, NTHL1, PALB2, PHOX2B, PMS2, POT1, PRKAR1A, PTCH1, PTEN, RAD51C, RAD51D, RB1, RECQL, RET, SDHA, SDHAF2, SDHB, SDHC, SDHD, SMAD4, SMARCA4, SMARCB1, SMARCE1, STK11, SUFU, TMEM127, TP53, TSC1, TSC2, VHL and XRCC2 (sequencing and deletion/duplication); EGFR, EGLN1, HOXB13, KIT, MITF, PDGFRA, POLD1, and POLE (sequencing only); EPCAM and GREM1 (deletion/duplication only).       INTERVAL HISTORY:  Holly Sanders is here for a follow up of Adenocarcinoma of the cecum. She was last seen by me on 05/12/2022. She presents to  the clinic alone. Pt state her BM are regular and some what form. Pt may go at least 1-2 time a day. Pt denies having any issues and she is clinically doing well.        All other systems were reviewed with the patient and are negative.  MEDICAL HISTORY:  Past Medical History:  Diagnosis Date   Anemia    Cancer (HCC)    CLL   Leukocytosis    Lupus erythematosus    Vitamin D deficiency     SURGICAL HISTORY: Past Surgical History:  Procedure Laterality Date   CESAREAN SECTION     COLONOSCOPY  03/07/2022    I have reviewed the social history and family history with the patient and they are unchanged from previous note.  ALLERGIES:  has No Known Allergies.  MEDICATIONS:  Current Outpatient Medications  Medication Sig Dispense Refill   Cholecalciferol (VITAMIN D-3) 125 MCG (5000 UT) TABS Take 1 tablet by mouth daily.     ferrous sulfate 325 (65 FE) MG tablet Take 325 mg by mouth daily.     traMADol (ULTRAM) 50 MG tablet Take 1-2 tablets (50-100 mg total) by mouth every 6 (six) hours as needed for moderate pain or severe pain. 20 tablet 0   No current facility-administered medications for this visit.    PHYSICAL EXAMINATION: ECOG PERFORMANCE STATUS: 0 - Asymptomatic  Vitals:   09/06/22 1156  BP: 116/63  Pulse: 84  Resp: 18  Temp: 98.4 F (36.9 C)  SpO2: 99%   Wt Readings from Last 3 Encounters:  09/06/22 194 lb 8 oz (88.2 kg)  05/12/22 191 lb 9.6 oz (86.9 kg)  04/21/22 195 lb (88.5 kg)     GENERAL:alert, no distress and comfortable SKIN: skin color normal, no rashes or significant lesions EYES: normal, Conjunctiva are pink and non-injected, sclera clear  NEURO: alert & oriented x 3 with fluent speech NECK: (-)supple, thyroid normal size, non-tender, without nodularity LYMPH:  (-) no palpable lymphadenopathy in the cervical, axillary  LUNGS:(-) clear to auscultation and percussion with normal breathing effort HEART:(-) regular rate & rhythm and (-) no murmurs and(-)no lower extremity edema ABDOMEN:(-)abdomen soft, (-) non-tender and (-) normal bowel sounds  LABORATORY DATA:  I have reviewed the data as listed    Latest Ref  Rng & Units 08/14/2022    9:57 AM 06/27/2022    3:48 PM 05/29/2022    9:55 AM  CBC  WBC 4.0 - 10.5 K/uL 10.1  12.8  11.3   Hemoglobin 12.0 - 15.0 g/dL 08.6  57.8  46.9   Hematocrit 36.0 - 46.0 % 38.0  36.9  33.8   Platelets 150 - 400 K/uL 234  271  274         Latest Ref Rng & Units 08/14/2022    9:57 AM 06/27/2022    3:48 PM 05/29/2022    9:55 AM  CMP  Glucose 70 - 99 mg/dL 89  92  629   BUN 6 - 20 mg/dL 12  16  16    Creatinine 0.44 - 1.00 mg/dL 5.28  4.13  2.44   Sodium 135 - 145 mmol/L 140  140  139   Potassium 3.5 - 5.1 mmol/L 4.0  4.3  4.2   Chloride 98 - 111 mmol/L 106  104  105   CO2 22 - 32 mmol/L 28  29  29    Calcium 8.9 - 10.3 mg/dL 9.2  9.9  9.0   Total Protein 6.5 - 8.1  g/dL 7.3  7.4  6.8   Total Bilirubin 0.3 - 1.2 mg/dL 0.5  0.3  0.3   Alkaline Phos 38 - 126 U/L 57  74  62   AST 15 - 41 U/L 14  15  13    ALT 0 - 44 U/L 13  15  12        RADIOGRAPHIC STUDIES: I have personally reviewed the radiological images as listed and agreed with the findings in the report. No results found.    Orders Placed This Encounter  Procedures   Guardant Reveal    Standing Status:   Future    Standing Expiration Date:   09/06/2023   All questions were answered. The patient knows to call the clinic with any problems, questions or concerns. No barriers to learning was detected. The total time spent in the appointment was 25 minutes.     Malachy Mood, MD 09/06/2022   Carolin Coy, CMA, am acting as scribe for Malachy Mood, MD.   I have reviewed the above documentation for accuracy and completeness, and I agree with the above.

## 2022-09-07 ENCOUNTER — Other Ambulatory Visit: Payer: Self-pay

## 2022-09-14 ENCOUNTER — Other Ambulatory Visit: Payer: Self-pay

## 2022-10-20 ENCOUNTER — Other Ambulatory Visit: Payer: Self-pay

## 2022-10-20 NOTE — Progress Notes (Signed)
Dr. Mosetta Putt did P2P with Dr. Hoy Morn [phone]) representative of pt's Insurance regarding Guardant Reveal order.  Outcome of P2P was Guardant Reveal was denied d/t pt has stage 2 colon cancer.  Dr. Mosetta Putt requested the Guardant Reveal ordered for Sept. 2024 to be canceled.

## 2022-11-21 ENCOUNTER — Other Ambulatory Visit: Payer: Self-pay

## 2022-12-25 ENCOUNTER — Other Ambulatory Visit: Payer: Self-pay

## 2022-12-25 ENCOUNTER — Other Ambulatory Visit: Payer: 59

## 2022-12-31 NOTE — Progress Notes (Unsigned)
Patient Care Team: Lewis Moccasin, MD as PCP - General (Family Medicine) Iva Boop, MD as Consulting Physician (Gastroenterology) Pollyann Savoy, MD as Consulting Physician (Rheumatology) Malachy Mood, MD as Consulting Physician (Oncology) Romie Levee, MD as Consulting Physician (General Surgery)   CHIEF COMPLAINT: Follow up adenocarcinoma of the cecum  Oncology History Overview Note   Cancer Staging  Adenocarcinoma of cecum [facility]) Staging form: Colon and Rectum, AJCC 8th Edition - Pathologic stage from 04/21/2022: Stage IIA (pT3, pN0, cM0) - Signed by Malachy Mood, MD on 05/11/2022 Stage prefix: Initial diagnosis Total positive nodes: 0 Histologic grading system: 4 grade system Histologic grade (G): G2 Residual tumor (R): R0 - None     Adenocarcinoma of cecum (HCC)  03/07/2022 Procedure   Colonoscopy by Dr. Leone Payor impression - An infiltrative, polypoid and ulcerated non-obstructing large mass was found in the cecum. The mass was circumferential. This was biopsied with a cold forceps for histology.   03/07/2022 Initial Biopsy   Cecum Biopsy - INVASIVE ADENOCARCINOMA, MODERATELY DIFFERENTIATED (SEE NOTE)   03/07/2022 Tumor Marker   CEA: normal < 2   03/16/2022 Initial Diagnosis   Adenocarcinoma of cecum (HCC)   04/21/2022 Cancer Staging   Staging form: Colon and Rectum, AJCC 8th Edition - Pathologic stage from 04/21/2022: Stage IIA (pT3, pN0, cM0) - Signed by Malachy Mood, MD on 05/11/2022 Stage prefix: Initial diagnosis Total positive nodes: 0 Histologic grading system: 4 grade system Histologic grade (G): G2 Residual tumor (R): R0 - None   08/10/2022 Genetic Testing   Negative [facility] CancerNext-Expanded.  Report date is 08/10/2022.   The CancerNext-Expanded gene panel offered by [facility] and includes sequencing and rearrangement for the following 77 genes: AIP, ALK, APC, ATM, AXIN2, BAP1, BARD1, BLM, BMPR1A, BRCA1, BRCA2, BRIP1, CDC73, CDH1, CDK4, CDKN1B,  CDKN2A, CHEK2, CTNNA1, DICER1, FANCC, FH, FLCN, GALNT12, KIF1B, LZTR1, MAX, MEN1, MET, MLH1, MSH2, MSH3, MSH6, MUTYH, NBN, NF1, NF2, NTHL1, PALB2, PHOX2B, PMS2, POT1, PRKAR1A, PTCH1, PTEN, RAD51C, RAD51D, RB1, RECQL, RET, SDHA, SDHAF2, SDHB, SDHC, SDHD, SMAD4, SMARCA4, SMARCB1, SMARCE1, STK11, SUFU, TMEM127, TP53, TSC1, TSC2, VHL and XRCC2 (sequencing and deletion/duplication); EGFR, EGLN1, HOXB13, KIT, MITF, PDGFRA, POLD1, and POLE (sequencing only); EPCAM and GREM1 (deletion/duplication only).       CURRENT THERAPY: Surveillance   INTERVAL HISTORY Holly Sanders returns for follow up as scheduled. Last seen by Dr. Mosetta Putt 09/06/22.  Doing well without significant changes in her overall health.  No longer taking oral iron.  Energy and appetite are normal.  Bowels moving normally, stools still somewhat loose.  Denies abdominal pain/bloating, rectal bleeding, or any other new or specific complaints.  ROS  All other systems reviewed and negative  Past Medical History:  Diagnosis Date   Anemia    Cancer (HCC)    CLL   Leukocytosis    Lupus erythematosus    Vitamin D deficiency      Past Surgical History:  Procedure Laterality Date   CESAREAN SECTION     COLONOSCOPY  03/07/2022     Outpatient Encounter Medications as of 01/01/2023  Medication Sig   Cholecalciferol (VITAMIN D-3) 125 MCG (5000 UT) TABS Take 1 tablet by mouth daily.   [DISCONTINUED] ferrous sulfate 325 (65 FE) MG tablet Take 325 mg by mouth daily.   [DISCONTINUED] traMADol (ULTRAM) 50 MG tablet Take 1-2 tablets (50-100 mg total) by mouth every 6 (six) hours as needed for moderate pain or severe pain.   No facility-administered encounter medications on file as of  01/01/2023.     Today's Vitals   01/01/23 1118 01/01/23 1122  BP: (!) 147/90 135/72  Pulse: 90   Resp: 17   Temp: 98.3 F (36.8 C)   TempSrc: Oral   SpO2: 100%   Weight: 203 lb 9.6 oz (92.4 kg)   Height: 5\' 7"  (1.702 m)    Body mass index is 31.89 kg/m.    PHYSICAL EXAM GENERAL:alert, no distress and comfortable SKIN: no rash  EYES: sclera clear NECK: without mass LYMPH:  no palpable cervical or supraclavicular lymphadenopathy  LUNGS: clear with normal breathing effort HEART: regular rate & rhythm, no lower extremity edema ABDOMEN: abdomen soft, non-tender and normal bowel sounds NEURO: alert & oriented x 3 with fluent speech   CBC    Component Value Date/Time   WBC 16.3 (H) 01/01/2023 1038   RBC 4.24 01/01/2023 1038   HGB 12.7 01/01/2023 1038   HGB 11.3 (L) 11/15/2021 1357   HCT 38.0 01/01/2023 1038   PLT 259 01/01/2023 1038   PLT 333 11/15/2021 1357   MCV 89.6 01/01/2023 1038   MCH 30.0 01/01/2023 1038   MCHC 33.4 01/01/2023 1038   RDW 12.9 01/01/2023 1038   LYMPHSABS PENDING 01/01/2023 1038   MONOABS PENDING 01/01/2023 1038   EOSABS PENDING 01/01/2023 1038   BASOSABS PENDING 01/01/2023 1038     CMP     Component Value Date/Time   NA 140 01/01/2023 1038   K 4.4 01/01/2023 1038   CL 104 01/01/2023 1038   CO2 29 01/01/2023 1038   GLUCOSE 89 01/01/2023 1038   BUN 16 01/01/2023 1038   CREATININE 0.85 01/01/2023 1038   CREATININE 0.85 05/16/2021 1123   CALCIUM 9.8 01/01/2023 1038   PROT 7.8 01/01/2023 1038   ALBUMIN 4.6 01/01/2023 1038   AST 19 01/01/2023 1038   AST 15 05/16/2021 1123   ALT 17 01/01/2023 1038   ALT 13 05/16/2021 1123   ALKPHOS 71 01/01/2023 1038   BILITOT 0.6 01/01/2023 1038   BILITOT 0.3 05/16/2021 1123   GFRNONAA >60 01/01/2023 1038   GFRNONAA >60 05/16/2021 1123     ASSESSMENT & PLAN:52 yo female    Adenocarcinoma of the cecum, G2, pT3N0M0, stage II, MMR normal  -Diagnosed 03/07/22, found to have biopsy proven adenocarcinoma of the cecum on first colonoscopy, she is asymptomatic.  -S/p right hemicolectomy by Dr. Maisie Fus 04/21/22; no high risk features. No adjuvant treatment needed -She has low to moderate recurrence risk, she is on surveillance -ctDNA has been negative; today's result is  pending  -Ms. Holzmann is clinically doing well.  Exam is benign, labs are stable.  Overall no clinical concern for recurrence.  Continue colon cancer surveillance  -Scheduled for colonoscopy in December with Dr. Leone Payor, will arrange surveillance scan in 3 months  -Follow-up in 4 months, then will move to q6 month follow-up if she is doing well   Stage 0 CLL -Diagnosed 05/2021, followed by Dr. Arbutus Ped, currently on observation -No B sx -CBC stable  3. Family history, genetics  -Pt has CLL and colon cancer, plus strong family history of multiple cancers. She qualifies for genetics and is interested -Result 07/13/22 NEGATIVE    PLAN: -CBC/CMP reviewed, will f/up the pending guardant reveal and CEA from today -Colonoscopy in December with Dr. Leone Payor -Surveillance CT in 3 months, will call with results -Lab and f/up in 4 months, or sooner if needed   PLAN:  Orders Placed This Encounter  Procedures   CT ABDOMEN PELVIS W CONTRAST  Standing Status:   Future    Standing Expiration Date:   01/01/2024    Order Specific Question:   If indicated for the ordered procedure, I authorize the administration of contrast media per Radiology protocol    Answer:   Yes    Order Specific Question:   Does the patient have a contrast media/X-ray dye allergy?    Answer:   No    Order Specific Question:   Is patient pregnant?    Answer:   No    Order Specific Question:   Preferred imaging location?    Answer:   [facility]    Order Specific Question:   If indicated for the ordered procedure, I authorize the administration of oral contrast media per Radiology protocol    Answer:   Yes      All questions were answered. The patient knows to call the clinic with any problems, questions or concerns. No barriers to learning were detected.   Santiago Glad, NP-C 01/01/2023

## 2023-01-01 ENCOUNTER — Inpatient Hospital Stay: Payer: 59

## 2023-01-01 ENCOUNTER — Encounter: Payer: Self-pay | Admitting: Nurse Practitioner

## 2023-01-01 ENCOUNTER — Other Ambulatory Visit: Payer: Self-pay

## 2023-01-01 ENCOUNTER — Inpatient Hospital Stay: Payer: 59 | Attending: Nurse Practitioner | Admitting: Nurse Practitioner

## 2023-01-01 VITALS — BP 135/72 | HR 90 | Temp 98.3°F | Resp 17 | Ht 67.0 in | Wt 203.6 lb

## 2023-01-01 DIAGNOSIS — C180 Malignant neoplasm of cecum: Secondary | ICD-10-CM

## 2023-01-01 DIAGNOSIS — Z85038 Personal history of other malignant neoplasm of large intestine: Secondary | ICD-10-CM | POA: Diagnosis not present

## 2023-01-01 DIAGNOSIS — C9110 Chronic lymphocytic leukemia of B-cell type not having achieved remission: Secondary | ICD-10-CM | POA: Insufficient documentation

## 2023-01-01 LAB — COMPREHENSIVE METABOLIC PANEL
ALT: 17 U/L (ref 0–44)
AST: 19 U/L (ref 15–41)
Albumin: 4.6 g/dL (ref 3.5–5.0)
Alkaline Phosphatase: 71 U/L (ref 38–126)
Anion gap: 7 (ref 5–15)
BUN: 16 mg/dL (ref 6–20)
CO2: 29 mmol/L (ref 22–32)
Calcium: 9.8 mg/dL (ref 8.9–10.3)
Chloride: 104 mmol/L (ref 98–111)
Creatinine, Ser: 0.85 mg/dL (ref 0.44–1.00)
GFR, Estimated: >60 mL/min (ref >60)
Glucose, Bld: 89 mg/dL (ref 70–99)
Potassium: 4.4 mmol/L (ref 3.5–5.1)
Sodium: 140 mmol/L (ref 135–145)
Total Bilirubin: 0.6 mg/dL (ref 0.3–1.2)
Total Protein: 7.8 g/dL (ref 6.5–8.1)

## 2023-01-01 LAB — CBC WITH DIFFERENTIAL/PLATELET
Abs Immature Granulocytes: 0.04 10*3/uL (ref 0.00–0.07)
Basophils Absolute: 0.1 10*3/uL (ref 0.0–0.1)
Basophils Relative: 0 %
Eosinophils Absolute: 0.2 10*3/uL (ref 0.0–0.5)
Eosinophils Relative: 1 %
HCT: 38 % (ref 36.0–46.0)
Hemoglobin: 12.7 g/dL (ref 12.0–15.0)
Immature Granulocytes: 0 %
Lymphocytes Relative: 65 %
Lymphs Abs: 10.5 10*3/uL — ABNORMAL HIGH (ref 0.7–4.0)
MCH: 30 pg (ref 26.0–34.0)
MCHC: 33.4 g/dL (ref 30.0–36.0)
MCV: 89.6 fL (ref 80.0–100.0)
Monocytes Absolute: 0.9 10*3/uL (ref 0.1–1.0)
Monocytes Relative: 5 %
Neutro Abs: 4.7 10*3/uL (ref 1.7–7.7)
Neutrophils Relative %: 29 %
Platelets: 259 10*3/uL (ref 150–400)
RBC: 4.24 MIL/uL (ref 3.87–5.11)
RDW: 12.9 % (ref 11.5–15.5)
Smear Review: NORMAL
WBC: 16.3 10*3/uL — ABNORMAL HIGH (ref 4.0–10.5)
nRBC: 0 % (ref 0.0–0.2)

## 2023-01-01 LAB — CEA (IN HOUSE-CHCC): CEA (CHCC-In House): <1 ng/mL (ref 0.00–5.00)

## 2023-01-09 ENCOUNTER — Encounter: Payer: Self-pay | Admitting: Hematology

## 2023-01-10 LAB — GUARDANT REVEAL

## 2023-01-25 ENCOUNTER — Encounter: Payer: Self-pay | Admitting: Family Medicine

## 2023-01-29 ENCOUNTER — Encounter: Payer: Self-pay | Admitting: Nurse Practitioner

## 2023-02-16 ENCOUNTER — Ambulatory Visit (AMBULATORY_SURGERY_CENTER): Payer: 59

## 2023-02-16 VITALS — Ht 67.0 in | Wt 199.0 lb

## 2023-02-16 DIAGNOSIS — Z85038 Personal history of other malignant neoplasm of large intestine: Secondary | ICD-10-CM

## 2023-02-16 MED ORDER — NA SULFATE-K SULFATE-MG SULF 17.5-3.13-1.6 GM/177ML PO SOLN: 1.0000 | Freq: Once | ORAL | 0 refills | Status: AC

## 2023-02-16 NOTE — Progress Notes (Signed)

## 2023-02-27 ENCOUNTER — Encounter: Payer: Self-pay | Admitting: Internal Medicine

## 2023-03-14 ENCOUNTER — Encounter: Payer: Self-pay | Admitting: Certified Registered Nurse Anesthetist

## 2023-03-14 NOTE — Progress Notes (Unsigned)
[facility] History and Physical   Primary Care Physician:  Lewis Moccasin, MD   Reason for Procedure:   Hx colon cancer  Plan:    colonoscopy     HPI: Holly Sanders is a 52 y.o. female here for initial surveillance s/p resection of cecal cancer about 1 year ago.   Past Medical History:  Diagnosis Date   Anemia    CLL (chronic lymphocytic leukemia) (HCC)    Colon cancer (HCC) 02/2022   Leukocytosis    Lupus erythematosus    Vitamin D deficiency     Past Surgical History:  Procedure Laterality Date   CESAREAN SECTION     COLON RESECTION     COLONOSCOPY  03/07/2022    Prior to Admission medications   Medication Sig Start Date End Date Taking? Authorizing Provider  Cholecalciferol (VITAMIN D-3) 125 MCG (5000 UT) TABS Take 1 tablet by mouth daily.    [provider]  phentermine 15 MG capsule Take 15 mg by mouth every morning. 01/19/23   [provider]    Current Outpatient Medications  Medication Sig Dispense Refill   Cholecalciferol (VITAMIN D-3) 125 MCG (5000 UT) TABS Take 1 tablet by mouth daily.     phentermine 15 MG capsule Take 15 mg by mouth every morning.     No current facility-administered medications for this visit.    Allergies as of 03/15/2023   (No Known Allergies)    Family History  Problem Relation Age of Onset   Heart disease Mother    Lymphoma Father        dx 39s; recurrence in 75s   Squamous cell carcinoma Father    Heart disease Brother    Breast cancer Paternal Aunt        dx > 50   Colon cancer Paternal Grandmother        or stomach cancer?; dx after 50   Colon polyps Paternal Grandmother    Breast cancer Paternal Grandmother        dx after 43   Esophageal cancer Paternal Grandfather    Throat cancer Paternal Grandfather        d. > 50   Breast cancer Cousin        paternal female cousin; dx 6s   Rectal cancer Neg Hx    Stomach cancer Neg Hx     Social History   Socioeconomic History    Marital status: Legally Separated    Spouse name: Not on file   Number of children: Not on file   Years of education: Not on file   Highest education level: Not on file  Occupational History   Not on file  Tobacco Use   Smoking status: Never   Smokeless tobacco: Never  Vaping Use   Vaping status: Never Used  Substance and Sexual Activity   Alcohol use: No    Comment: rare, wine   Drug use: No   Sexual activity: Not on file  Other Topics Concern   Not on file  Social History Narrative   Not on file   Social Determinants of Health   Financial Resource Strain: Not on file  Food Insecurity: Patient Declined (04/21/2022)   Hunger Vital Sign    Worried About Running Out of Food in the Last Year: Patient declined    Ran Out of Food in the Last Year: Patient declined  Transportation Needs: Patient Declined (04/21/2022)   PRAPARE - Administrator, Civil Service (Medical):  Patient declined    Lack of Transportation (Non-Medical): Patient declined  Physical Activity: Not on file  Stress: Not on file  Social Connections: Not on file  Intimate Partner Violence: Patient Declined (04/21/2022)   Humiliation, Afraid, Rape, and Kick questionnaire    Fear of Current or Ex-Partner: Patient declined    Emotionally Abused: Patient declined    Physically Abused: Patient declined    Sexually Abused: Patient declined    Review of Systems: Positive for *** All other review of systems negative except as mentioned in the HPI.  Physical Exam: Vital signs There were no vitals taken for this visit.  General:   Alert,  Well-developed, well-nourished, pleasant and cooperative in NAD Lungs:  Clear throughout to auscultation.   Heart:  Regular rate and rhythm; no murmurs, clicks, rubs,  or gallops. Abdomen:  Soft, nontender and nondistended. Normal bowel sounds.   Neuro/Psych:  Alert and cooperative. Normal mood and affect. A and O x 3   @Holly Sanders  Sena Slate, MD, [facility]  Gastroenterology [phone] (pager) 03/14/2023 5:16 PM@

## 2023-03-15 ENCOUNTER — Ambulatory Visit: Payer: 59 | Admitting: Internal Medicine

## 2023-03-15 ENCOUNTER — Encounter: Payer: Self-pay | Admitting: Internal Medicine

## 2023-03-15 VITALS — BP 133/86 | HR 84 | Temp 98.6°F | Resp 11 | Ht 67.0 in | Wt 199.0 lb

## 2023-03-15 DIAGNOSIS — Z85038 Personal history of other malignant neoplasm of large intestine: Secondary | ICD-10-CM | POA: Diagnosis not present

## 2023-03-15 DIAGNOSIS — Z980 Intestinal bypass and anastomosis status: Secondary | ICD-10-CM | POA: Diagnosis not present

## 2023-03-15 DIAGNOSIS — Z1211 Encounter for screening for malignant neoplasm of colon: Secondary | ICD-10-CM | POA: Diagnosis present

## 2023-03-15 MED ORDER — SODIUM CHLORIDE 0.9 % IV SOLN: 500.0000 mL | Freq: Once | INTRAVENOUS | Status: DC

## 2023-03-15 NOTE — Progress Notes (Signed)
Report given to PACU, vss 

## 2023-03-15 NOTE — Patient Instructions (Addendum)
The examination was a normal post-operative exam. No polyps and no cancer!  Next routine exam will be in 3 years.  I appreciate the opportunity to care for you. Iva Boop, MD, FACG  YOU HAD AN ENDOSCOPIC PROCEDURE TODAY AT THE [facility]:   Refer to the procedure report that was given to you for any specific questions about what was found during the examination.  If the procedure report does not answer your questions, please call your gastroenterologist to clarify.  If you requested that your care partner not be given the details of your procedure findings, then the procedure report has been included in a sealed envelope for you to review at your convenience later.  YOU SHOULD EXPECT: Some feelings of bloating in the abdomen. Passage of more gas than usual.  Walking can help get rid of the air that was put into your GI tract during the procedure and reduce the bloating. If you had a lower endoscopy (such as a colonoscopy or flexible sigmoidoscopy) you may notice spotting of blood in your stool or on the toilet paper. If you underwent a bowel prep for your procedure, you may not have a normal bowel movement for a few days.  Please Note:  You might notice some irritation and congestion in your nose or some drainage.  This is from the oxygen used during your procedure.  There is no need for concern and it should clear up in a day or so.  SYMPTOMS TO REPORT IMMEDIATELY:  Following lower endoscopy (colonoscopy or flexible sigmoidoscopy):  Excessive amounts of blood in the stool  Significant tenderness or worsening of abdominal pains  Swelling of the abdomen that is new, acute  Fever of 100F or higher  For urgent or emergent issues, a gastroenterologist can be reached at any hour by calling [phone]. Do not use MyChart messaging for urgent concerns.    DIET:  We do recommend a small meal at first, but then you may proceed to your regular diet.  Drink plenty of fluids but  you should avoid alcoholic beverages for 24 hours.  ACTIVITY:  You should plan to take it easy for the rest of today and you should NOT DRIVE or use heavy machinery until tomorrow (because of the sedation medicines used during the test).    FOLLOW UP: Our staff will call the number listed on your records the next business day following your procedure.  We will call around 7:15- 8:00 am to check on you and address any questions or concerns that you may have regarding the information given to you following your procedure. If we do not reach you, we will leave a message.     SIGNATURES/CONFIDENTIALITY: You and/or your care partner have signed paperwork which will be entered into your electronic medical record.  These signatures attest to the fact that that the information above on your After Visit Summary has been reviewed and is understood.  Full responsibility of the confidentiality of this discharge information lies with you and/or your care-partner.

## 2023-03-15 NOTE — Op Note (Signed)
[facility] Patient Name: Holly Sanders Procedure Date: 03/15/2023 1:54 PM MRN: [id number] Endoscopist: Iva Boop , MD, [id number] Age: 52 Referring MD:  Date of Birth: 11-Nov-1970 Gender: Female Account #: [id number] Procedure:                Colonoscopy Indications:              High risk colon cancer surveillance: Personal                            history of colon cancer, Last colonoscopy: November                            2023 Medicines:                Monitored Anesthesia Care Procedure:                Pre-Anesthesia Assessment:                           - Prior to the procedure, a History and Physical                            was performed, and patient medications and                            allergies were reviewed. The patient's tolerance of                            previous anesthesia was also reviewed. The risks                            and benefits of the procedure and the sedation                            options and risks were discussed with the patient.                            All questions were answered, and informed consent                            was obtained. Prior Anticoagulants: The patient has                            taken no anticoagulant or antiplatelet agents. ASA                            Grade Assessment: II - A patient with mild systemic                            disease. After reviewing the risks and benefits,                            the patient was deemed in satisfactory condition to  undergo the procedure.                           After obtaining informed consent, the colonoscope                            was passed under direct vision. Throughout the                            procedure, the patient's blood pressure, pulse, and                            oxygen saturations were monitored continuously. The                            Olympus Scope SN [id number] was introduced through the                             anus and advanced to the the ileocolonic                            anastomosis. The colonoscopy was performed without                            difficulty. The patient tolerated the procedure                            well. The quality of the bowel preparation was                            excellent. The bowel preparation used was SUPREP                            via split dose instruction. The rectum and                            Ileocolonic anastomsis areas were photographed. Scope In: 2:09:41 PM Scope Out: 2:16:14 PM Scope Withdrawal Time: 0 hours 5 minutes 27 seconds  Total Procedure Duration: 0 hours 6 minutes 33 seconds  Findings:                 The perianal and digital rectal examinations were                            normal.                           There was evidence of a prior end-to-side                            ileo-colonic anastomosis in the transverse colon.                            This was patent and was characterized by healthy  appearing mucosa.                           The exam was otherwise without abnormality on                            direct and retroflexion views. Complications:            No immediate complications. Estimated Blood Loss:     Estimated blood loss: none. Impression:               - Patent end-to-side ileo-colonic anastomosis,                            characterized by healthy appearing mucosa.                           - The examination was otherwise normal on direct                            and retroflexion views.                           - No specimens collected.                           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 The signs and symptoms  of potential                            delayed complications were discussed with the                            patient. Return to normal activities tomorrow.                            Written discharge instructions were provided to the                            patient.                           - Resume previous diet.                           - Continue present medications.                           -  Repeat colonoscopy in 3 years for surveillance. Iva Boop, MD 03/15/2023 2:24:53 PM This report has been signed electronically.

## 2023-03-16 ENCOUNTER — Telehealth: Payer: Self-pay

## 2023-03-16 NOTE — Telephone Encounter (Signed)
  Follow up Call-     03/15/2023    1:12 PM 03/07/2022   12:41 PM  Call back number  Post procedure Call Back phone  # [phone]  Permission to leave phone message Yes Yes     Patient questions:  Do you have a fever, pain , or abdominal swelling? No. Pain Score  0 *  Have you tolerated food without any problems? Yes.    Have you been able to return to your normal activities? Yes.    Do you have any questions about your discharge instructions: Diet   No. Medications  No. Follow up visit  No.  Do you have questions or concerns about your Care? No.  Actions: * If pain score is 4 or above: No action needed, pain <4.

## 2023-04-02 ENCOUNTER — Ambulatory Visit (HOSPITAL_COMMUNITY)
Admission: RE | Admit: 2023-04-02 | Discharge: 2023-04-02 | Disposition: A | Discharge status: Home / Self Care | Payer: 59 | Source: Ambulatory Visit | Attending: Nurse Practitioner | Admitting: Nurse Practitioner

## 2023-04-02 DIAGNOSIS — C180 Malignant neoplasm of cecum: Secondary | ICD-10-CM | POA: Diagnosis present

## 2023-04-02 MED ORDER — IOHEXOL 300 MG/ML  SOLN
30.0000 mL | Freq: Once | INTRAMUSCULAR | Status: AC | PRN
  Administered 2023-04-02: 30 mL via ORAL

## 2023-04-02 MED ORDER — IOHEXOL 300 MG/ML  SOLN
100.0000 mL | Freq: Once | INTRAMUSCULAR | Status: AC | PRN
  Administered 2023-04-02: 100 mL via INTRAVENOUS

## 2023-04-10 ENCOUNTER — Other Ambulatory Visit: Payer: Self-pay

## 2023-04-10 DIAGNOSIS — C180 Malignant neoplasm of cecum: Secondary | ICD-10-CM

## 2023-04-10 NOTE — Progress Notes (Signed)
Guardant Reveal ordered and paperwork given to [facility] Selena Batten).  Pt scheduled for lab draw on 05/02/2023

## 2023-05-02 ENCOUNTER — Inpatient Hospital Stay: Payer: 59 | Attending: Nurse Practitioner

## 2023-05-02 ENCOUNTER — Encounter: Payer: Self-pay | Admitting: Nurse Practitioner

## 2023-05-02 ENCOUNTER — Inpatient Hospital Stay (HOSPITAL_BASED_OUTPATIENT_CLINIC_OR_DEPARTMENT_OTHER): Payer: 59 | Admitting: Nurse Practitioner

## 2023-05-02 VITALS — BP 132/85 | HR 102 | Temp 98.1°F | Resp 17 | Ht 67.0 in | Wt 195.6 lb

## 2023-05-02 DIAGNOSIS — C180 Malignant neoplasm of cecum: Secondary | ICD-10-CM

## 2023-05-02 DIAGNOSIS — C9110 Chronic lymphocytic leukemia of B-cell type not having achieved remission: Secondary | ICD-10-CM | POA: Diagnosis not present

## 2023-05-02 DIAGNOSIS — Z809 Family history of malignant neoplasm, unspecified: Secondary | ICD-10-CM | POA: Diagnosis not present

## 2023-05-02 LAB — COMPREHENSIVE METABOLIC PANEL
ALT: 15 U/L (ref 0–44)
AST: 16 U/L (ref 15–41)
Albumin: 4.3 g/dL (ref 3.5–5.0)
Alkaline Phosphatase: 66 U/L (ref 38–126)
Anion gap: 6 (ref 5–15)
BUN: 15 mg/dL (ref 6–20)
CO2: 28 mmol/L (ref 22–32)
Calcium: 9.8 mg/dL (ref 8.9–10.3)
Chloride: 105 mmol/L (ref 98–111)
Creatinine, Ser: 0.87 mg/dL (ref 0.44–1.00)
GFR, Estimated: >60 mL/min (ref >60)
Glucose, Bld: 98 mg/dL (ref 70–99)
Potassium: 3.8 mmol/L (ref 3.5–5.1)
Sodium: 139 mmol/L (ref 135–145)
Total Bilirubin: 0.4 mg/dL (ref 0.0–1.2)
Total Protein: 7.5 g/dL (ref 6.5–8.1)

## 2023-05-02 LAB — CBC WITH DIFFERENTIAL/PLATELET
Abs Immature Granulocytes: 0.03 10*3/uL (ref 0.00–0.07)
Basophils Absolute: 0.1 10*3/uL (ref 0.0–0.1)
Basophils Relative: 1 %
Eosinophils Absolute: 0.1 10*3/uL (ref 0.0–0.5)
Eosinophils Relative: 1 %
HCT: 39.9 % (ref 36.0–46.0)
Hemoglobin: 13.1 g/dL (ref 12.0–15.0)
Immature Granulocytes: 0 %
Lymphocytes Relative: 62 %
Lymphs Abs: 8 10*3/uL — ABNORMAL HIGH (ref 0.7–4.0)
MCH: 29.7 pg (ref 26.0–34.0)
MCHC: 32.8 g/dL (ref 30.0–36.0)
MCV: 90.5 fL (ref 80.0–100.0)
Monocytes Absolute: 0.7 10*3/uL (ref 0.1–1.0)
Monocytes Relative: 5 %
Neutro Abs: 4 10*3/uL (ref 1.7–7.7)
Neutrophils Relative %: 31 %
Platelets: 270 10*3/uL (ref 150–400)
RBC: 4.41 MIL/uL (ref 3.87–5.11)
RDW: 12.9 % (ref 11.5–15.5)
Smear Review: NORMAL
WBC: 12.9 10*3/uL — ABNORMAL HIGH (ref 4.0–10.5)
nRBC: 0 % (ref 0.0–0.2)

## 2023-05-02 NOTE — Progress Notes (Signed)
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 Leone Payor impression - An infiltrative, polypoid and ulcerated non-obstructing large mass was found in the cecum. The mass was circumferential. This was biopsied with a cold forceps for histology.   03/07/2022 Initial Biopsy   Cecum Biopsy - INVASIVE ADENOCARCINOMA, MODERATELY DIFFERENTIATED (SEE NOTE)   03/07/2022 Tumor Marker   CEA: normal < 2   03/16/2022 Initial Diagnosis   Adenocarcinoma of cecum (HCC)   04/21/2022 Cancer Staging   Staging form: Colon and Rectum, AJCC 8th Edition - Pathologic stage from 04/21/2022: Stage IIA (pT3, pN0, cM0) - Signed by Malachy Mood, MD on 05/11/2022 Stage prefix: Initial diagnosis Total positive nodes: 0 Histologic grading system: 4 grade system Histologic grade (G): G2 Residual tumor (R): R0 - None   08/10/2022 Genetic Testing   Negative [facility] CancerNext-Expanded.  Report date is 08/10/2022.   The CancerNext-Expanded gene panel offered by [facility] and includes sequencing and rearrangement for the following 77 genes: AIP, ALK, APC, ATM, AXIN2, BAP1, BARD1, BLM, BMPR1A, BRCA1, BRCA2, BRIP1, CDC73, CDH1, CDK4,  CDKN1B, CDKN2A, CHEK2, CTNNA1, DICER1, FANCC, FH, FLCN, GALNT12, KIF1B, LZTR1, MAX, MEN1, MET, MLH1, MSH2, MSH3, MSH6, MUTYH, NBN, NF1, NF2, NTHL1, PALB2, PHOX2B, PMS2, POT1, PRKAR1A, PTCH1, PTEN, RAD51C, RAD51D, RB1, RECQL, RET, SDHA, SDHAF2, SDHB, SDHC, SDHD, SMAD4, SMARCA4, SMARCB1, SMARCE1, STK11, SUFU, TMEM127, TP53, TSC1, TSC2, VHL and XRCC2 (sequencing and deletion/duplication); EGFR, EGLN1, HOXB13, KIT, MITF, PDGFRA, POLD1, and POLE (sequencing only); EPCAM and GREM1 (deletion/duplication only).       CURRENT THERAPY: Surveillance  INTERVAL HISTORY Ms. Lewy returns for follow up as scheduled. Last seen by me 01/01/23. Colonoscopy and CT were done in December. Doing well overall. Losing weight intentionally. Denies change in bowel habits, bloody stools, abdominal pain/bloating, fever/night sweats or any other specific concerns.   ROS  All other systems reviewed and negative  Past Medical History:  Diagnosis Date   Anemia    CLL (chronic lymphocytic leukemia) (HCC)    Colon cancer (HCC) 02/2022   Leukocytosis    Lupus erythematosus    Vitamin D deficiency      Past Surgical History:  Procedure Laterality Date   CESAREAN SECTION     COLON RESECTION     COLONOSCOPY  03/07/2022     Outpatient Encounter Medications as of 05/02/2023  Medication Sig   Cholecalciferol (VITAMIN D-3) 125 MCG (5000 UT) TABS Take 1 tablet by mouth daily.   phentermine 15 MG capsule Take 15 mg by mouth every morning.   No facility-administered encounter medications on file as of 05/02/2023.     Today's Vitals   05/02/23 1129  BP: 132/85  Pulse: (!) 102  Resp: 17  Temp: 98.1 F (36.7 C)  TempSrc: Temporal  SpO2: 99%  Weight: 195 lb 9 oz (88.7 kg)  Height: 5\' 7"  (1.702 m)   Body mass index is 30.63 kg/m.   PHYSICAL EXAM GENERAL:alert, no distress and comfortable SKIN: no rash  EYES: sclera clear NECK: without mass LUNGS: clear with normal breathing effort HEART: regular rate & rhythm,  no lower extremity edema ABDOMEN: abdomen soft, non-tender and normal bowel sounds NEURO: alert & oriented x 3 with fluent speech, no focal motor/sensory deficits   CBC    Component Value Date/Time   WBC 12.9 (H) 05/02/2023 1107   RBC 4.41 05/02/2023 1107   HGB 13.1 05/02/2023 1107   HGB 11.3 (L) 11/15/2021 1357   HCT 39.9 05/02/2023 1107   PLT 270 05/02/2023 1107   PLT 333 11/15/2021 1357   MCV 90.5 05/02/2023 1107   MCH 29.7 05/02/2023 1107   MCHC 32.8 05/02/2023 1107   RDW 12.9 05/02/2023 1107   LYMPHSABS PENDING 05/02/2023 1107   MONOABS PENDING 05/02/2023 1107   EOSABS PENDING 05/02/2023 1107   BASOSABS PENDING 05/02/2023 1107     CMP     Component Value Date/Time   NA 140 01/01/2023 1038   K 4.4 01/01/2023 1038   CL 104 01/01/2023 1038   CO2 29 01/01/2023 1038   GLUCOSE 89 01/01/2023 1038   BUN 16 01/01/2023 1038   CREATININE 0.85 01/01/2023 1038   CREATININE 0.85 05/16/2021 1123   CALCIUM 9.8 01/01/2023 1038   PROT 7.8 01/01/2023 1038   ALBUMIN 4.6 01/01/2023 1038   AST 19 01/01/2023 1038   AST 15 05/16/2021 1123   ALT 17 01/01/2023 1038   ALT 13 05/16/2021 1123   ALKPHOS 71 01/01/2023 1038   BILITOT 0.6 01/01/2023 1038   BILITOT 0.3 05/16/2021 1123   GFRNONAA >60 01/01/2023 1038   GFRNONAA >60 05/16/2021 1123     ASSESSMENT & PLAN:52 yo female    Adenocarcinoma of the cecum, G2, pT3N0M0, stage II, MMR normal  -Diagnosed 03/07/22, found to have biopsy proven adenocarcinoma of the cecum on first colonoscopy, she is asymptomatic.  -Baseline CEA normal -S/p right hemicolectomy by Dr. Maisie Fus 04/21/22; no high risk features. No adjuvant treatment needed -She has low to moderate recurrence risk, she is on surveillance -ctDNA has been negative x4 (most recent 01/01/23), today's level is pending  -Colonoscopy by Dr. Leone Payor 03/15/2023 was benign, no biopsies were taken, 3-year recall -Surveillance CT AP 04/02/2023 was benign -Ms. Wilbourn is clinically doing  well, exam is benign, labs are unremarkable. No concern for recurrence -Will follow up the pending ctDNA from today.  -RTC in 6months, or sooner if needed; we reviewed s/sx to notify    Stage 0 CLL -Diagnosed 05/2021, followed by Dr. Arbutus Ped, currently on observation -CBC is stable, no B symptoms   3. Family history, genetics  -Pt has CLL and colon cancer, plus strong family history of multiple cancers. She qualifies for genetics and is interested -Result 07/13/22 NEGATIVE    PLAN: -Recent colonoscopy, CT and today's labs reviewed, NED -Will f/up the pending ctDNA from today -Continue colon cancer surveillance -F/up in 6 months, or sooner if needed    All questions were answered. The patient knows to call the clinic with any problems, questions or concerns. No barriers to learning were detected. I spent 20 minutes counseling the patient face to face. The total time spent in the appointment was 30 minutes and more than 50% was on counseling, review of test results, and coordination of care.   Santiago Glad, NP-C 05/02/2023

## 2023-05-04 ENCOUNTER — Other Ambulatory Visit: Payer: Self-pay

## 2023-05-07 ENCOUNTER — Encounter: Payer: Self-pay | Admitting: Hematology

## 2023-05-17 LAB — GUARDANT REVEAL

## 2023-05-18 ENCOUNTER — Encounter: Payer: Self-pay | Admitting: Nurse Practitioner

## 2023-07-09 IMAGING — US US BREAST*L* LIMITED INC AXILLA
2 series · 7 of 7 positions shown · non-contrast
Comparison: None.

CLINICAL DATA: Palpable abnormality in the LEFT breast noted about
a month ago. Mass has resolved.

EXAM:
DIGITAL DIAGNOSTIC BILATERAL MAMMOGRAM WITH TOMOSYNTHESIS AND CAD;
ULTRASOUND LEFT BREAST LIMITED
TECHNIQUE: Bilateral digital diagnostic mammography and breast tomosynthesis
was performed. The images were evaluated with computer-aided
detection.; Targeted ultrasound examination of the left breast was
performed.

[Series 1: us breast*left* limited inc axilla · 0.08mm/px · 5 of 5 slices shown (1 of 2)]
[im 1/5]
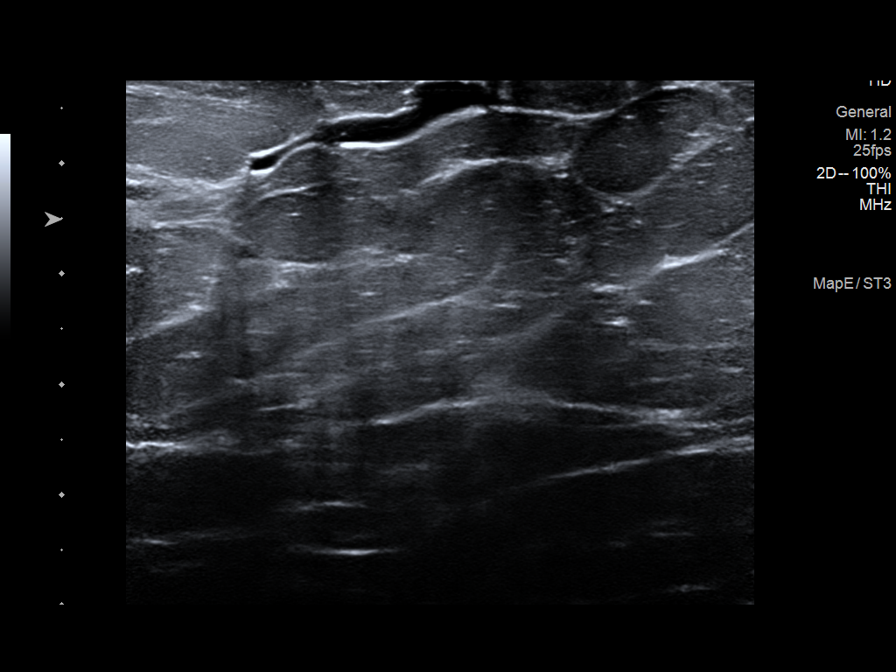
[im 2/5]
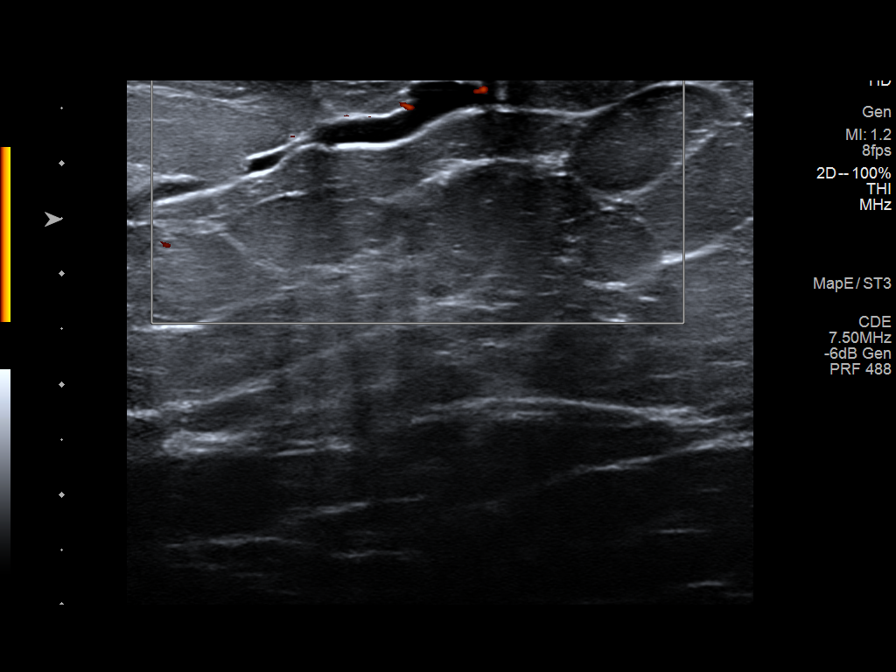
[im 3/5]
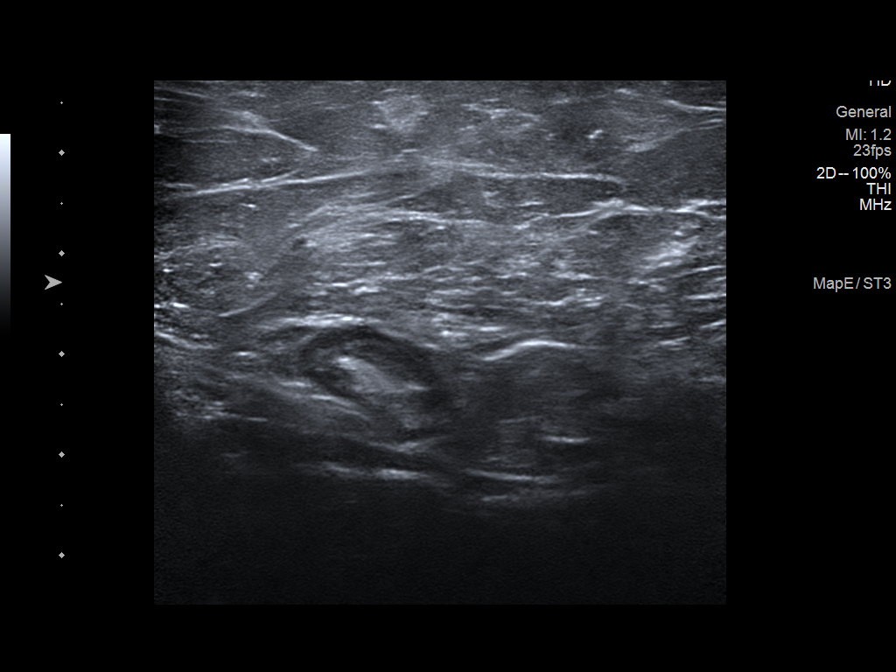
[im 4/5]
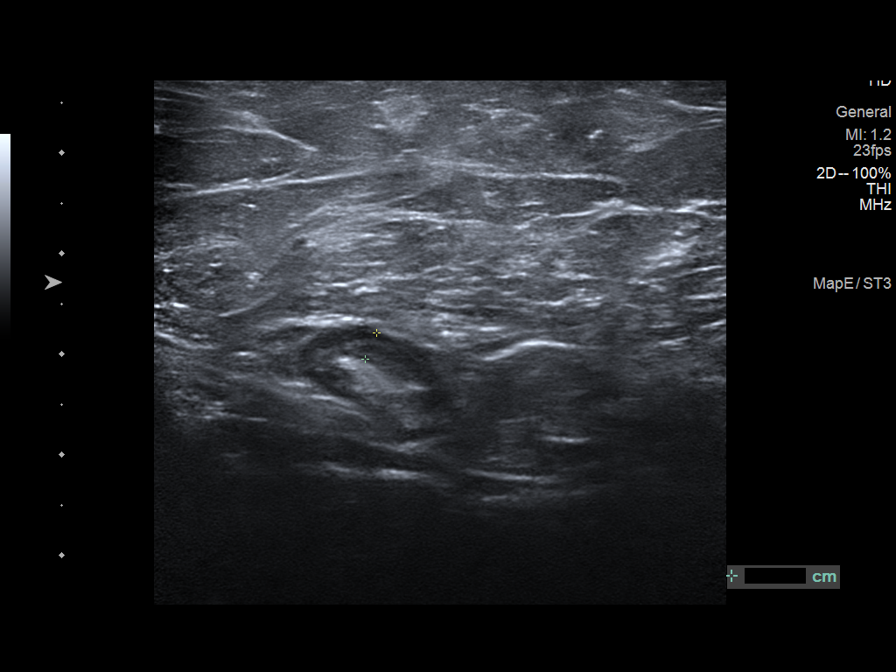
[im 5/5]
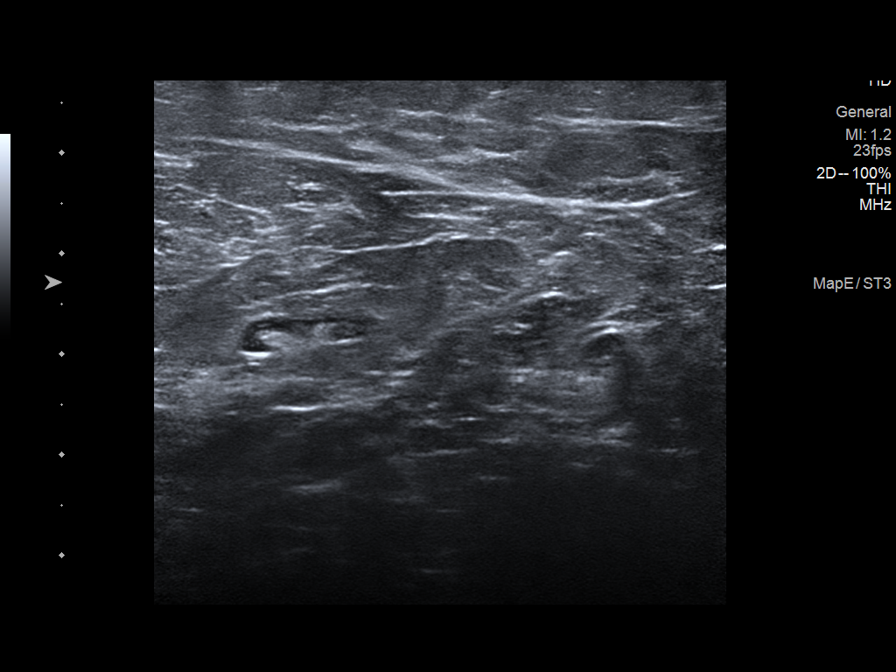

[Series 2: us breast*left* limited inc axilla · 0.07mm/px · 2 of 2 slices shown (2 of 2)]
[im 1/2]
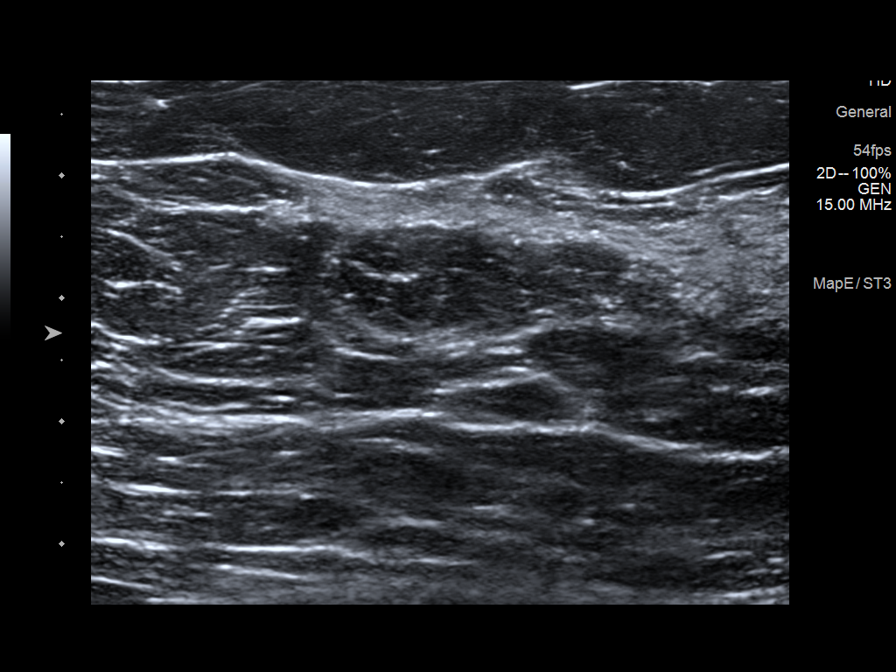
[im 2/2]
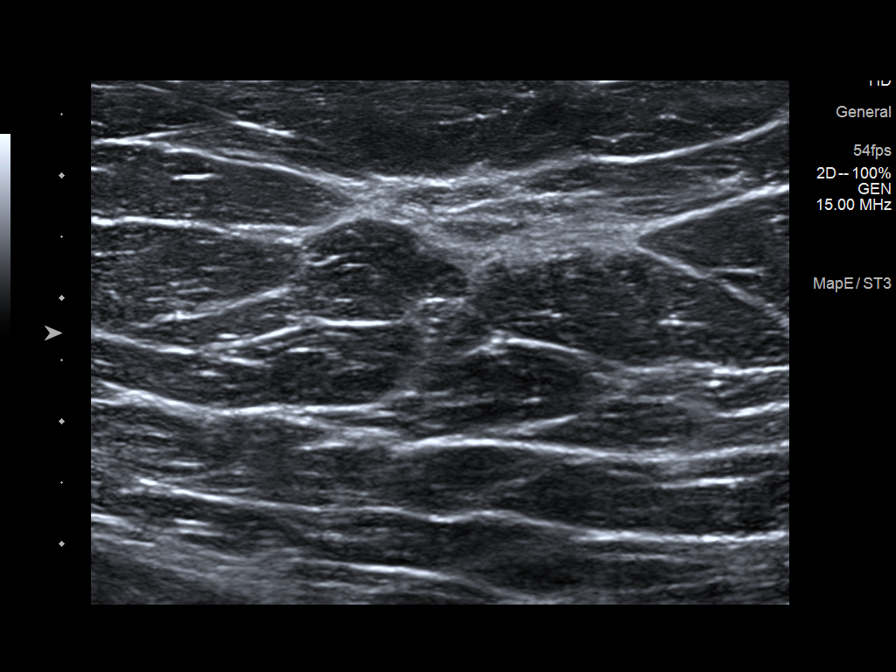

[7 of 7 positions shown; findings below may reference images not displayed]

ACR Breast Density Category b: There are scattered areas of
fibroglandular density.
FINDINGS: No suspicious mass, distortion, or microcalcifications are
identified to suggest presence of malignancy.

On physical exam, I palpate no discrete mass in the 12 o'clock
location of the LEFT breast 8 centimeters from the nipple, in the
area where patient originally palpated a mass. Patient is no longer
able to feel the mass. There are no visible changes of the skin in
this region.

Targeted ultrasound is performed, showing normal appearing
fibrofatty tissue in the area of patient's concern. Incidental note
is made a mildly dilated duct in the 12 o'clock location of the LEFT
breast 1 centimeter from the nipple, not associated with intraductal
mass or suspicious features.
IMPRESSION: No mammographic or ultrasound evidence for malignancy.

RECOMMENDATION:
Screening mammogram in one year.(Code:66-I-PAO)

I have discussed the findings and recommendations with the patient.
If applicable, a reminder letter will be sent to the patient
regarding the next appointment.

BI-RADS CATEGORY  2: Benign.

## 2023-07-09 IMAGING — MG DIGITAL DIAGNOSTIC BILAT W/ TOMO W/ CAD
8 series · 8 of 24 positions shown · non-contrast
Comparison: None.

CLINICAL DATA: Palpable abnormality in the LEFT breast noted about
a month ago. Mass has resolved.

EXAM:
DIGITAL DIAGNOSTIC BILATERAL MAMMOGRAM WITH TOMOSYNTHESIS AND CAD;
ULTRASOUND LEFT BREAST LIMITED
TECHNIQUE: Bilateral digital diagnostic mammography and breast tomosynthesis
was performed. The images were evaluated with computer-aided
detection.; Targeted ultrasound examination of the left breast was
performed.

[L MLO synth-2D]
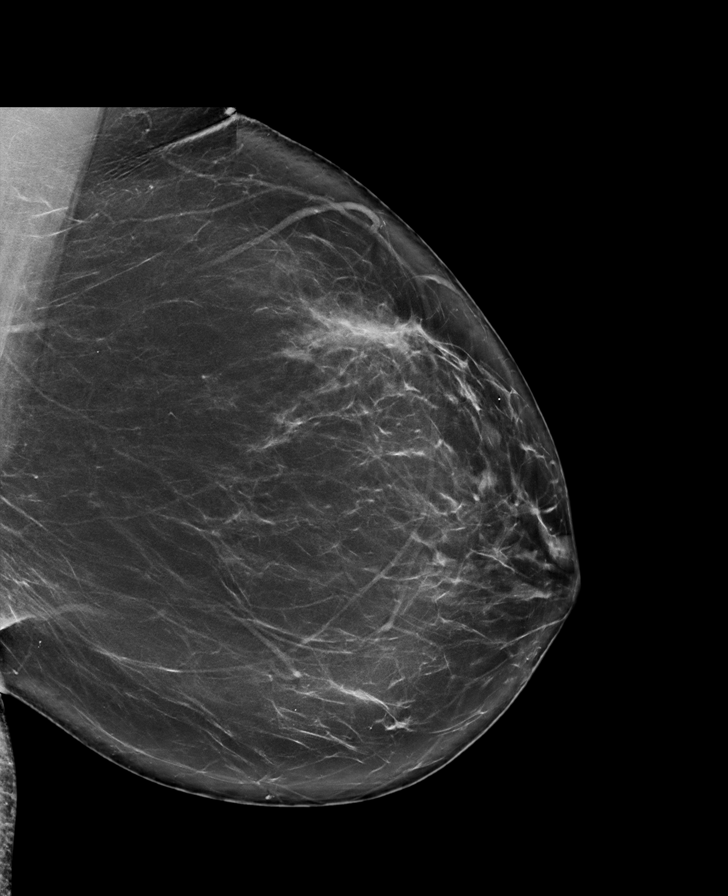

[R CC synth-2D]
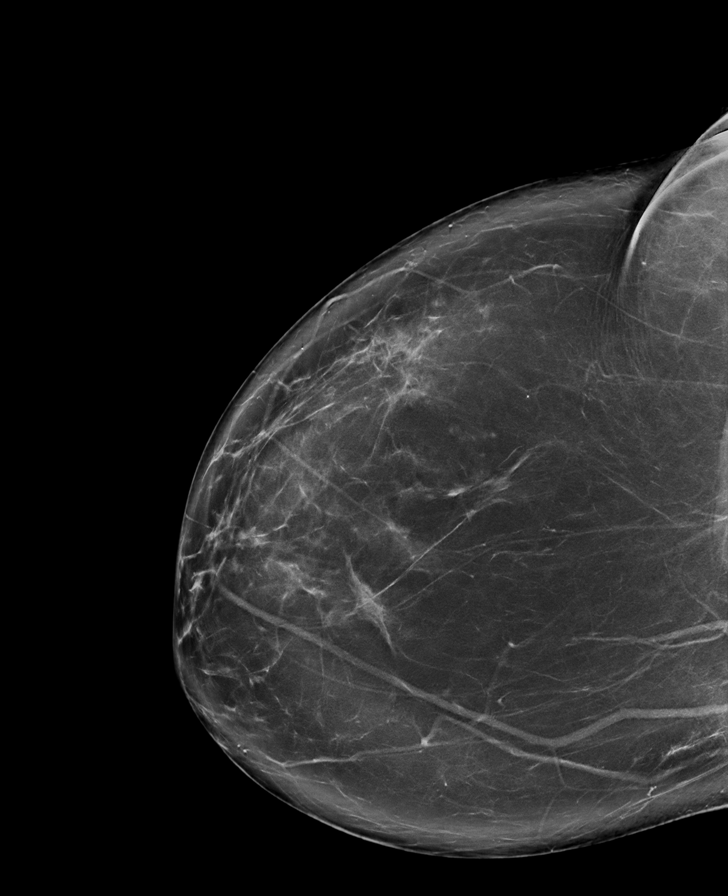

[R MLO synth-2D]
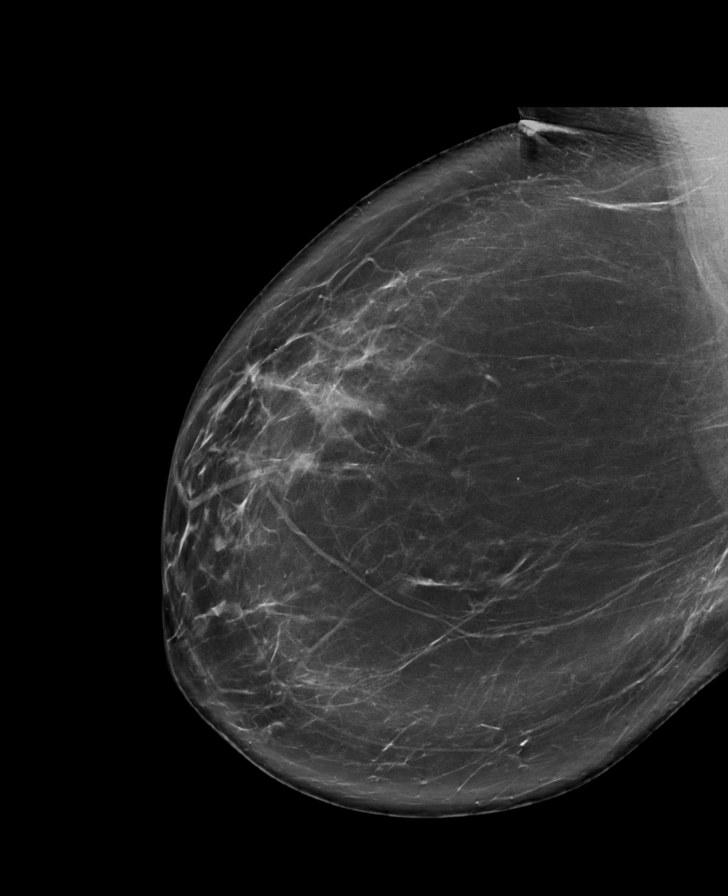

[L CC synth-2D]
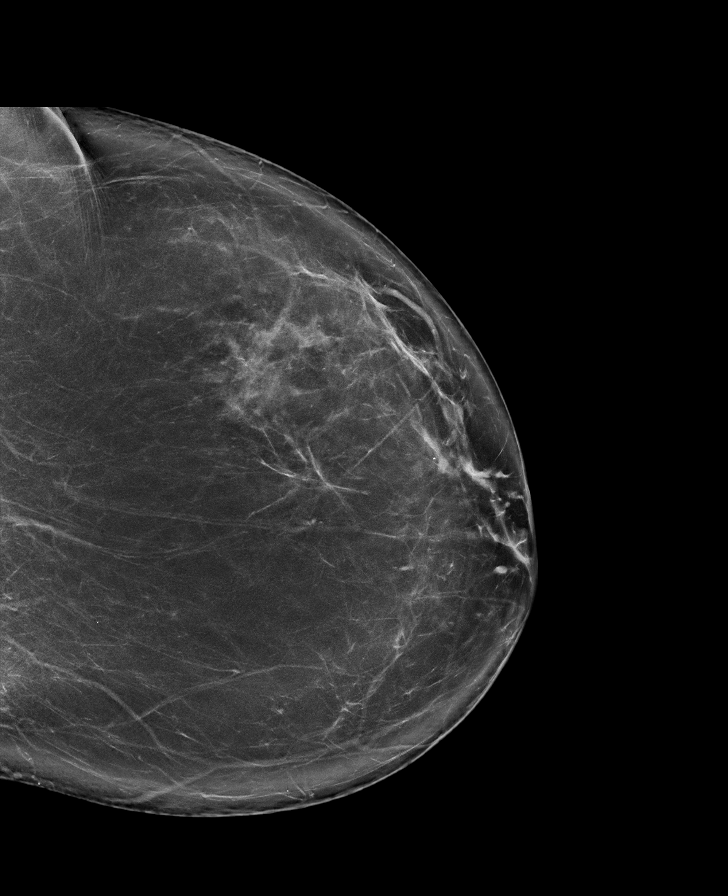

[L MLO tomo · tomo slice 45/90.0]
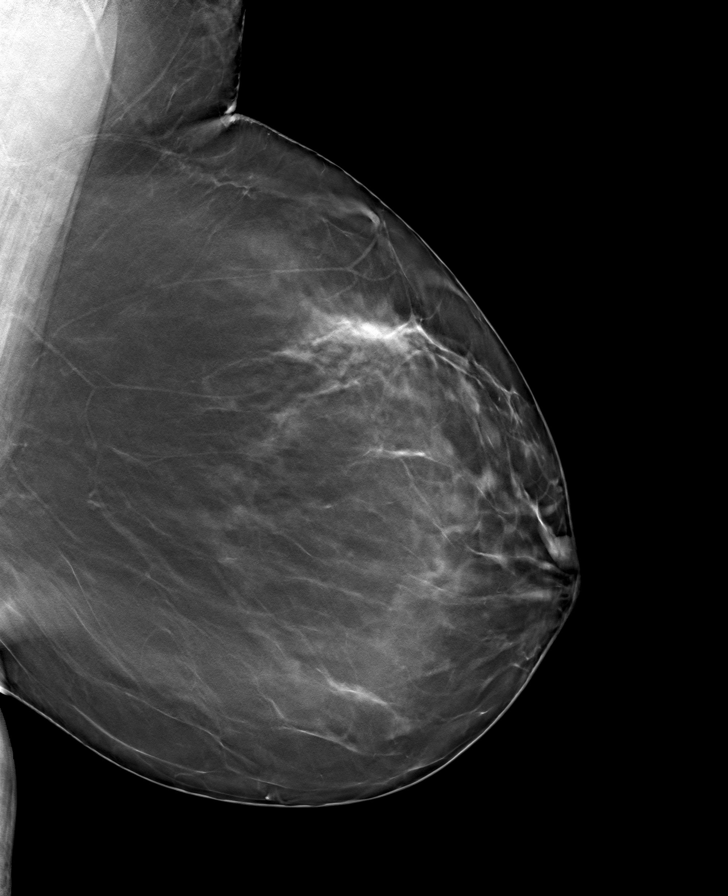

[L CC tomo · tomo slice 45/90.0]
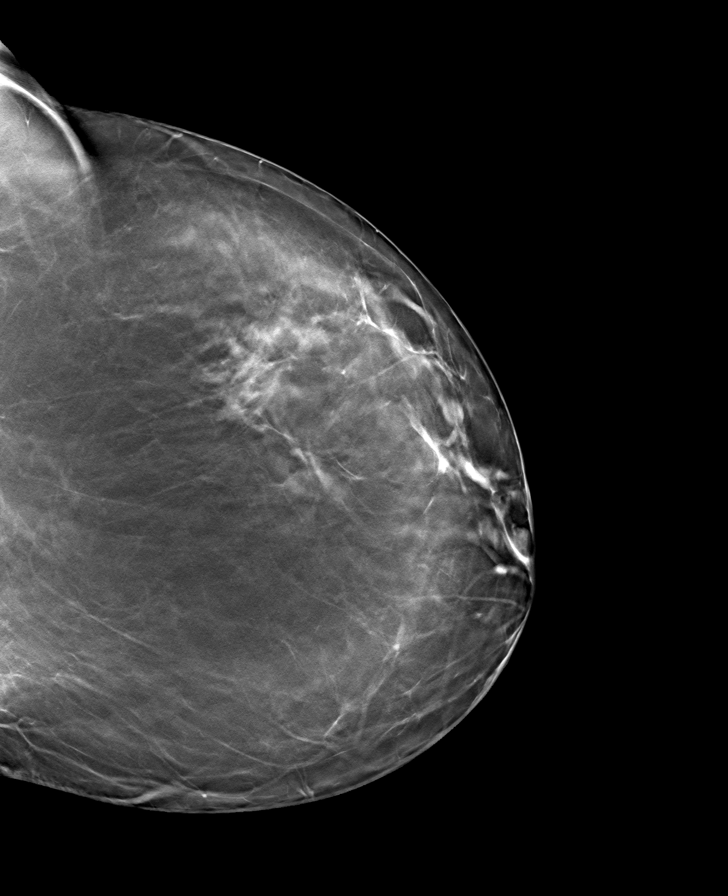

[R CC tomo · tomo slice 47/94.0]
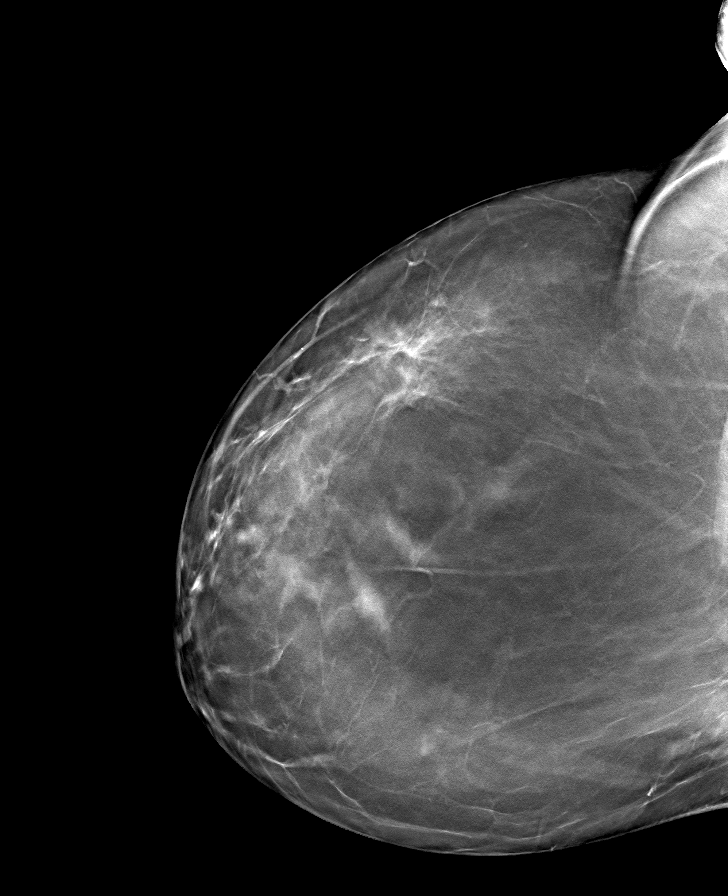

[R MLO tomo · tomo slice 46/91.0]
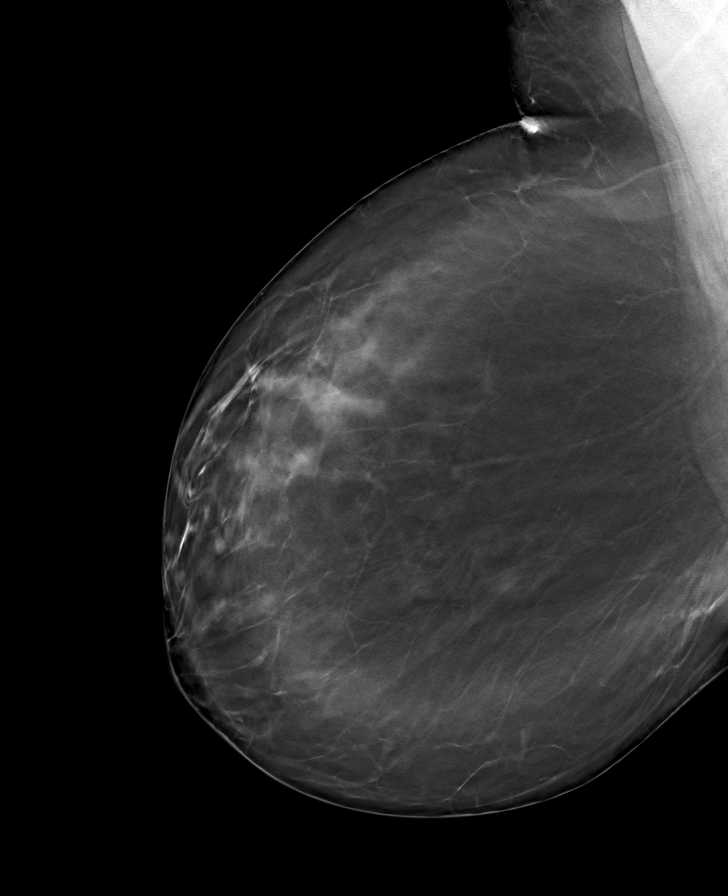

[8 of 24 positions shown; findings below may reference images not displayed]

ACR Breast Density Category b: There are scattered areas of
fibroglandular density.
FINDINGS: No suspicious mass, distortion, or microcalcifications are
identified to suggest presence of malignancy.

On physical exam, I palpate no discrete mass in the 12 o'clock
location of the LEFT breast 8 centimeters from the nipple, in the
area where patient originally palpated a mass. Patient is no longer
able to feel the mass. There are no visible changes of the skin in
this region.

Targeted ultrasound is performed, showing normal appearing
fibrofatty tissue in the area of patient's concern. Incidental note
is made a mildly dilated duct in the 12 o'clock location of the LEFT
breast 1 centimeter from the nipple, not associated with intraductal
mass or suspicious features.
IMPRESSION: No mammographic or ultrasound evidence for malignancy.

RECOMMENDATION:
Screening mammogram in one year.(Code:66-I-PAO)

I have discussed the findings and recommendations with the patient.
If applicable, a reminder letter will be sent to the patient
regarding the next appointment.

BI-RADS CATEGORY  2: Benign.

## 2023-08-16 ENCOUNTER — Other Ambulatory Visit: Payer: Self-pay

## 2023-08-16 NOTE — Progress Notes (Signed)
 Gave paperwork to the lab to have Guardant Reveal drawn on 05/23 as per Dr. Maryalice Smaller. Called an made patient aware of appointment and she had no further questions at this time.

## 2023-08-23 ENCOUNTER — Other Ambulatory Visit: Payer: Self-pay

## 2023-08-23 DIAGNOSIS — C182 Malignant neoplasm of ascending colon: Secondary | ICD-10-CM

## 2023-08-23 DIAGNOSIS — C180 Malignant neoplasm of cecum: Secondary | ICD-10-CM

## 2023-08-31 ENCOUNTER — Inpatient Hospital Stay: Attending: Nurse Practitioner

## 2023-08-31 DIAGNOSIS — C180 Malignant neoplasm of cecum: Secondary | ICD-10-CM

## 2023-08-31 DIAGNOSIS — C182 Malignant neoplasm of ascending colon: Secondary | ICD-10-CM

## 2023-09-07 ENCOUNTER — Encounter: Payer: Self-pay | Admitting: Hematology

## 2023-09-11 LAB — GUARDANT REVEAL

## 2023-09-14 ENCOUNTER — Ambulatory Visit: Payer: Self-pay | Admitting: Hematology

## 2023-09-14 NOTE — Telephone Encounter (Addendum)
 Called patient to relay message below as per Dr. Maryalice Smaller, patient voiced full understanding and had no further questions at this time.   ----- Message from Sonja Amado sent at 09/14/2023 12:39 PM EDT ----- Let pt know the result, thx   Sonja 

## 2023-09-19 ENCOUNTER — Other Ambulatory Visit: Payer: Self-pay | Admitting: Family Medicine

## 2023-09-19 DIAGNOSIS — Z1231 Encounter for screening mammogram for malignant neoplasm of breast: Secondary | ICD-10-CM

## 2023-10-04 ENCOUNTER — Ambulatory Visit
Admission: RE | Admit: 2023-10-04 | Discharge: 2023-10-04 | Disposition: A | Discharge status: Home / Self Care | Source: Ambulatory Visit | Attending: Family Medicine | Admitting: Family Medicine

## 2023-10-04 DIAGNOSIS — Z1231 Encounter for screening mammogram for malignant neoplasm of breast: Secondary | ICD-10-CM

## 2023-10-30 ENCOUNTER — Other Ambulatory Visit: Payer: Self-pay

## 2023-10-30 ENCOUNTER — Inpatient Hospital Stay (HOSPITAL_BASED_OUTPATIENT_CLINIC_OR_DEPARTMENT_OTHER): Payer: 59 | Admitting: Hematology

## 2023-10-30 ENCOUNTER — Encounter: Payer: Self-pay | Admitting: Hematology

## 2023-10-30 ENCOUNTER — Inpatient Hospital Stay: Payer: 59 | Attending: Nurse Practitioner

## 2023-10-30 VITALS — BP 138/82 | HR 91 | Temp 98.1°F | Resp 14 | Wt 194.0 lb

## 2023-10-30 DIAGNOSIS — C182 Malignant neoplasm of ascending colon: Secondary | ICD-10-CM

## 2023-10-30 DIAGNOSIS — C180 Malignant neoplasm of cecum: Secondary | ICD-10-CM

## 2023-10-30 DIAGNOSIS — D649 Anemia, unspecified: Secondary | ICD-10-CM | POA: Diagnosis not present

## 2023-10-30 DIAGNOSIS — Z85038 Personal history of other malignant neoplasm of large intestine: Secondary | ICD-10-CM | POA: Insufficient documentation

## 2023-10-30 DIAGNOSIS — C9110 Chronic lymphocytic leukemia of B-cell type not having achieved remission: Secondary | ICD-10-CM | POA: Diagnosis present

## 2023-10-30 LAB — CMP (CANCER CENTER ONLY)
ALT: 14 U/L (ref 0–44)
AST: 17 U/L (ref 15–41)
Albumin: 4.2 g/dL (ref 3.5–5.0)
Alkaline Phosphatase: 65 U/L (ref 38–126)
Anion gap: 9 (ref 5–15)
BUN: 15 mg/dL (ref 6–20)
CO2: 25 mmol/L (ref 22–32)
Calcium: 9.3 mg/dL (ref 8.9–10.3)
Chloride: 104 mmol/L (ref 98–111)
Creatinine: 0.76 mg/dL (ref 0.44–1.00)
GFR, Estimated: >60 mL/min (ref >60)
Glucose, Bld: 82 mg/dL (ref 70–99)
Potassium: 3.9 mmol/L (ref 3.5–5.1)
Sodium: 138 mmol/L (ref 135–145)
Total Bilirubin: 0.3 mg/dL (ref 0.0–1.2)
Total Protein: 7.4 g/dL (ref 6.5–8.1)

## 2023-10-30 LAB — CBC WITH DIFFERENTIAL (CANCER CENTER ONLY)
Abs Immature Granulocytes: 0.02 K/uL (ref 0.00–0.07)
Basophils Absolute: 0.1 K/uL (ref 0.0–0.1)
Basophils Relative: 0 %
Eosinophils Absolute: 0.2 K/uL (ref 0.0–0.5)
Eosinophils Relative: 2 %
HCT: 36.9 % (ref 36.0–46.0)
Hemoglobin: 12.2 g/dL (ref 12.0–15.0)
Immature Granulocytes: 0 %
Lymphocytes Relative: 65 %
Lymphs Abs: 8.9 K/uL — ABNORMAL HIGH (ref 0.7–4.0)
MCH: 29.5 pg (ref 26.0–34.0)
MCHC: 33.1 g/dL (ref 30.0–36.0)
MCV: 89.3 fL (ref 80.0–100.0)
Monocytes Absolute: 0.9 K/uL (ref 0.1–1.0)
Monocytes Relative: 7 %
Neutro Abs: 3.6 K/uL (ref 1.7–7.7)
Neutrophils Relative %: 26 %
Platelet Count: 237 K/uL (ref 150–400)
RBC: 4.13 MIL/uL (ref 3.87–5.11)
RDW: 12.8 % (ref 11.5–15.5)
Smear Review: NORMAL
WBC Count: 13.8 K/uL — ABNORMAL HIGH (ref 4.0–10.5)
nRBC: 0 % (ref 0.0–0.2)

## 2023-10-30 NOTE — Progress Notes (Signed)
 [facility]   Telephone:(336) [phone] Fax:(336) [phone]   Clinic Follow up Note   Patient Care Team: Holly Almarie SAUNDERS, MD as PCP - General (Family Medicine) Holly Lupita BRAVO, MD as Consulting Physician (Gastroenterology) Holly Reiter, MD as Consulting Physician (Rheumatology) Holly Callander, MD as Consulting Physician (Oncology) Holly Hila, MD as Consulting Physician (General Surgery)  Date of Service:  10/30/2023  CHIEF COMPLAINT: f/u of colon cancer  CURRENT THERAPY:  Cancer surveillance  Oncology History   Adenocarcinoma of cecum (HCC) pT3N0M0, MSS, G2 -diagnosed in 03/07/2022 through screening colonoscopy -She underwent a right hemicolectomy by Dr. Debby on April 21, 2022 -I reviewed her surgical pathology findings with her in detail.  She had a stage II disease, without high risk features. -We discussed her risk is low to moderate, adjuvant chemotherapy is not recommended given no high risk features -I recommend checking circulating tumor DNA with GuardantReveal which has been negative  -Repeated CT in 03/2023 was negative for recurrence   Assessment & Plan Stage 2 colon cancer Stage 2 colon cancer diagnosed in November 2023, with surgery performed in January 2024. Follow-up colonoscopy was normal, and Guardian Reveal tests have consistently been negative. No current evidence of disease recurrence. - Schedule CT scan at the end of the year - Repeat Guardian Reveal test at the end of the year  Chronic lymphocytic leukemia (CLL) Chronic lymphocytic leukemia diagnosed in 2023. Current blood counts are well-managed with a white blood count of 13.8 and lymphocytes at 8.9, consistent with previous results. No anemia or thrombocytopenia present. No treatment required at this time. - Monitor blood counts every six months - Coordinate with primary care physician for interim lab work  Anemia Anemia in 2024, likely related to colon cancer surgery. Resolved  post-surgery with iron  infusions. Current blood work shows no anemia.  Allergic reaction requiring EpiPen  Recent allergic reaction to an unknown insect bite or sting, requiring the use of an inhaler and resulting in the prescription of an EpiPen . - Ensure EpiPen  is available for future allergic reactions  Plan - She is clinically doing well, exam was unremarkable, no clinical concern for recurrence - Continue cancer surveillance, follow-up in 5 months with lab and surveillance CT scan   SUMMARY OF ONCOLOGIC HISTORY: Oncology History Overview Note   Cancer Staging  Adenocarcinoma of cecum [facility]) Staging form: Colon and Rectum, AJCC 8th Edition - Pathologic stage from 04/21/2022: Stage IIA (pT3, pN0, cM0) - Signed by Holly Callander, MD on 05/11/2022 Stage prefix: Initial diagnosis Total positive nodes: 0 Histologic grading system: 4 grade system Histologic grade (G): G2 Residual tumor (R): R0 - None     Adenocarcinoma of cecum (HCC)  03/07/2022 Procedure   Colonoscopy by Dr. Avram impression - An infiltrative, polypoid and ulcerated non-obstructing large mass was found in the cecum. The mass was circumferential. This was biopsied with a cold forceps for histology.   03/07/2022 Initial Biopsy   Cecum Biopsy - INVASIVE ADENOCARCINOMA, MODERATELY DIFFERENTIATED (SEE NOTE)   03/07/2022 Tumor Marker   CEA: normal < 2   03/16/2022 Initial Diagnosis   Adenocarcinoma of cecum (HCC)   04/21/2022 Cancer Staging   Staging form: Colon and Rectum, AJCC 8th Edition - Pathologic stage from 04/21/2022: Stage IIA (pT3, pN0, cM0) - Signed by Holly Callander, MD on 05/11/2022 Stage prefix: Initial diagnosis Total positive nodes: 0 Histologic grading system: 4 grade system Histologic grade (G): G2 Residual tumor (R): R0 - None   08/10/2022 Genetic Testing   Negative [facility]  CancerNext-Expanded.  Report date is 08/10/2022.   The CancerNext-Expanded gene panel offered by [facility] and includes sequencing  and rearrangement for the following 77 genes: AIP, ALK, APC, ATM, AXIN2, BAP1, BARD1, BLM, BMPR1A, BRCA1, BRCA2, BRIP1, CDC73, CDH1, CDK4, CDKN1B, CDKN2A, CHEK2, CTNNA1, DICER1, FANCC, FH, FLCN, GALNT12, KIF1B, LZTR1, MAX, MEN1, MET, MLH1, MSH2, MSH3, MSH6, MUTYH, NBN, NF1, NF2, NTHL1, PALB2, PHOX2B, PMS2, POT1, PRKAR1A, PTCH1, PTEN, RAD51C, RAD51D, RB1, RECQL, RET, SDHA, SDHAF2, SDHB, SDHC, SDHD, SMAD4, SMARCA4, SMARCB1, SMARCE1, STK11, SUFU, TMEM127, TP53, TSC1, TSC2, VHL and XRCC2 (sequencing and deletion/duplication); EGFR, EGLN1, HOXB13, KIT, MITF, PDGFRA, POLD1, and POLE (sequencing only); EPCAM and GREM1 (deletion/duplication only).       Discussed the use of AI scribe software for clinical note transcription with the patient, who gave verbal consent to proceed.  History of Present Illness Holly [facility] is a 53 year old female with stage two colon cancer and chronic lymphocytic leukemia who presents for follow-up.  She was diagnosed with stage two colon cancer in November 2023 and underwent surgery in January 2024. Her follow-up colonoscopy was normal, and Guardian Reveal tests have consistently been negative.  Chronic lymphocytic leukemia was diagnosed around the same time as her colon cancer. Her blood counts have remained stable, with a white blood count of 13.8 and lymphocyte count of 8.9. Regular blood tests are conducted to monitor her condition.  She experienced anemia in 2024, treated with iron  infusions prior to her colon cancer surgery. Since the surgery, she has not experienced further anemia.     All other systems were reviewed with the patient and are negative.  MEDICAL HISTORY:  Past Medical History:  Diagnosis Date   Anemia    CLL (chronic lymphocytic leukemia) (HCC)    Colon cancer (HCC) 02/2022   Leukocytosis    Lupus erythematosus    Vitamin D  deficiency     SURGICAL HISTORY: Past Surgical History:  Procedure Laterality Date   CESAREAN SECTION     COLON  RESECTION     COLONOSCOPY  03/07/2022    I have reviewed the social history and family history with the patient and they are unchanged from previous note.  ALLERGIES:  has no known allergies.  MEDICATIONS:  Current Outpatient Medications  Medication Sig Dispense Refill   Cholecalciferol  (VITAMIN D -3) 125 MCG (5000 UT) TABS Take 1 tablet by mouth daily.     phentermine 15 MG capsule Take 15 mg by mouth every morning.     No current facility-administered medications for this visit.    PHYSICAL EXAMINATION: ECOG PERFORMANCE STATUS: 0 - Asymptomatic  Vitals:   10/30/23 1506  BP: 138/82  Pulse: 91  Resp: 14  Temp: 98.1 F (36.7 C)  SpO2: 99%   Wt Readings from Last 3 Encounters:  10/30/23 194 lb (88 kg)  05/02/23 195 lb 9 oz (88.7 kg)  03/15/23 199 lb (90.3 kg)     GENERAL:alert, no distress and comfortable SKIN: skin color, texture, turgor are normal, no rashes or significant lesions EYES: normal, Conjunctiva are pink and non-injected, sclera clear NECK: supple, thyroid  normal size, non-tender, without nodularity LYMPH:  no palpable lymphadenopathy in the cervical, axillary  LUNGS: clear to auscultation and percussion with normal breathing effort HEART: regular rate & rhythm and no murmurs and no lower extremity edema ABDOMEN:abdomen soft, non-tender and normal bowel sounds Musculoskeletal:no cyanosis of digits and no clubbing  NEURO: alert & oriented x 3 with fluent speech, no focal motor/sensory deficits  Physical Exam CHEST:  Lungs normal. ABDOMEN: Abdomen normal. Liver not tender, not enlarged.  LABORATORY DATA:  I have reviewed the data as listed    Latest Ref Rng & Units 10/30/2023    2:36 PM 05/02/2023   11:07 AM 01/01/2023   10:38 AM  CBC  WBC 4.0 - 10.5 K/uL 13.8  12.9  16.3   Hemoglobin 12.0 - 15.0 g/dL 87.7  86.8  87.2   Hematocrit 36.0 - 46.0 % 36.9  39.9  38.0   Platelets 150 - 400 K/uL 237  270  259         Latest Ref Rng & Units 10/30/2023     2:36 PM 05/02/2023   11:07 AM 01/01/2023   10:38 AM  CMP  Glucose 70 - 99 mg/dL 82  98  89   BUN 6 - 20 mg/dL 15  15  16    Creatinine 0.44 - 1.00 mg/dL 9.23  9.12  9.14   Sodium 135 - 145 mmol/L 138  139  140   Potassium 3.5 - 5.1 mmol/L 3.9  3.8  4.4   Chloride 98 - 111 mmol/L 104  105  104   CO2 22 - 32 mmol/L 25  28  29    Calcium  8.9 - 10.3 mg/dL 9.3  9.8  9.8   Total Protein 6.5 - 8.1 g/dL 7.4  7.5  7.8   Total Bilirubin 0.0 - 1.2 mg/dL 0.3  0.4  0.6   Alkaline Phos 38 - 126 U/L 65  66  71   AST 15 - 41 U/L 17  16  19    ALT 0 - 44 U/L 14  15  17        RADIOGRAPHIC STUDIES: I have personally reviewed the radiological images as listed and agreed with the findings in the report. No results found.    Orders Placed This Encounter  Procedures   CT CHEST ABDOMEN PELVIS W CONTRAST    Standing Status:   Future    Expected Date:   03/29/2024    Expiration Date:   10/29/2024    If indicated for the ordered procedure, I authorize the administration of contrast media per Radiology protocol:   Yes    Does the patient have a contrast media/X-ray dye allergy?:   No    Preferred imaging location?:   [facility]    If indicated for the ordered procedure, I authorize the administration of oral contrast media per Radiology protocol:   Yes   All questions were answered. The patient knows to call the clinic with any problems, questions or concerns. No barriers to learning was detected. The total time spent in the appointment was 25 minutes, including review of chart and various tests results, discussions about plan of care and coordination of care plan     Onita Mattock, MD 10/30/2023

## 2023-10-30 NOTE — Assessment & Plan Note (Signed)
 pT3N0M0, MSS, G2 -diagnosed in 03/07/2022 through screening colonoscopy -She underwent a right hemicolectomy by Dr. Debby on April 21, 2022 -I reviewed her surgical pathology findings with her in detail.  She had a stage II disease, without high risk features. -We discussed her risk is low to moderate, adjuvant chemotherapy is not recommended given no high risk features -I recommend checking circulating tumor DNA with GuardantReveal which has been negative  -Repeated CT in 03/2023 was negative for recurrence

## 2023-11-01 ENCOUNTER — Telehealth: Payer: Self-pay | Admitting: Hematology

## 2023-11-01 NOTE — Telephone Encounter (Signed)
 Scheduled appointments per 7/22 los. Called and left a VM with appointment details for the patient.

## 2023-12-25 ENCOUNTER — Other Ambulatory Visit: Payer: Self-pay

## 2024-01-30 ENCOUNTER — Other Ambulatory Visit: Payer: Self-pay

## 2024-01-30 NOTE — Progress Notes (Signed)
 Made attempt to contact patient to have her Guardant Reveal drawn, made multiple attempts with no success the phone number was disconnected.

## 2024-02-27 ENCOUNTER — Other Ambulatory Visit: Payer: Self-pay

## 2024-02-27 DIAGNOSIS — C9110 Chronic lymphocytic leukemia of B-cell type not having achieved remission: Secondary | ICD-10-CM

## 2024-02-27 DIAGNOSIS — C180 Malignant neoplasm of cecum: Secondary | ICD-10-CM

## 2024-02-27 DIAGNOSIS — C182 Malignant neoplasm of ascending colon: Secondary | ICD-10-CM

## 2024-02-28 ENCOUNTER — Inpatient Hospital Stay: Attending: Hematology

## 2024-02-28 ENCOUNTER — Ambulatory Visit (HOSPITAL_COMMUNITY)
Admission: RE | Admit: 2024-02-28 | Discharge: 2024-02-28 | Disposition: A | Discharge status: Home / Self Care | Source: Ambulatory Visit | Attending: Hematology | Admitting: Hematology

## 2024-02-28 DIAGNOSIS — C180 Malignant neoplasm of cecum: Secondary | ICD-10-CM | POA: Insufficient documentation

## 2024-02-28 DIAGNOSIS — C9110 Chronic lymphocytic leukemia of B-cell type not having achieved remission: Secondary | ICD-10-CM

## 2024-02-28 DIAGNOSIS — C182 Malignant neoplasm of ascending colon: Secondary | ICD-10-CM

## 2024-02-28 LAB — CMP (CANCER CENTER ONLY)
ALT: 16 U/L (ref 0–44)
AST: 26 U/L (ref 15–41)
Albumin: 4.7 g/dL (ref 3.5–5.0)
Alkaline Phosphatase: 86 U/L (ref 38–126)
Anion gap: 11 (ref 5–15)
BUN: 12 mg/dL (ref 6–20)
CO2: 27 mmol/L (ref 22–32)
Calcium: 9.6 mg/dL (ref 8.9–10.3)
Chloride: 101 mmol/L (ref 98–111)
Creatinine: 0.88 mg/dL (ref 0.44–1.00)
GFR, Estimated: >60 mL/min (ref >60)
Glucose, Bld: 115 mg/dL — ABNORMAL HIGH (ref 70–99)
Potassium: 4.5 mmol/L (ref 3.5–5.1)
Sodium: 139 mmol/L (ref 135–145)
Total Bilirubin: 0.3 mg/dL (ref 0.0–1.2)
Total Protein: 7.8 g/dL (ref 6.5–8.1)

## 2024-02-28 LAB — CBC WITH DIFFERENTIAL (CANCER CENTER ONLY)
Abs Immature Granulocytes: 0.03 K/uL (ref 0.00–0.07)
Basophils Absolute: 0.1 K/uL (ref 0.0–0.1)
Basophils Relative: 0 %
Eosinophils Absolute: 0.3 K/uL (ref 0.0–0.5)
Eosinophils Relative: 2 %
HCT: 38.3 % (ref 36.0–46.0)
Hemoglobin: 13 g/dL (ref 12.0–15.0)
Immature Granulocytes: 0 %
Lymphocytes Relative: 67 %
Lymphs Abs: 10 K/uL — ABNORMAL HIGH (ref 0.7–4.0)
MCH: 30.4 pg (ref 26.0–34.0)
MCHC: 33.9 g/dL (ref 30.0–36.0)
MCV: 89.7 fL (ref 80.0–100.0)
Monocytes Absolute: 0.8 K/uL (ref 0.1–1.0)
Monocytes Relative: 6 %
Neutro Abs: 3.7 K/uL (ref 1.7–7.7)
Neutrophils Relative %: 25 %
Platelet Count: 253 K/uL (ref 150–400)
RBC: 4.27 MIL/uL (ref 3.87–5.11)
RDW: 12.9 % (ref 11.5–15.5)
WBC Count: 14.9 K/uL — ABNORMAL HIGH (ref 4.0–10.5)
nRBC: 0 % (ref 0.0–0.2)

## 2024-02-28 MED ORDER — IOHEXOL 9 MG/ML PO SOLN
ORAL | Status: AC
  Filled 2024-02-28: qty 1000

## 2024-02-28 MED ORDER — IOHEXOL 300 MG/ML  SOLN
100.0000 mL | Freq: Once | INTRAMUSCULAR | Status: AC | PRN
  Administered 2024-02-28: 100 mL via INTRAVENOUS

## 2024-02-28 MED ORDER — IOHEXOL 9 MG/ML PO SOLN
1000.0000 mL | Freq: Once | ORAL | Status: AC
  Administered 2024-02-28: 1000 mL via ORAL

## 2024-03-09 NOTE — Assessment & Plan Note (Signed)
 pT3N0M0, MSS, G2 -diagnosed in 03/07/2022 through screening colonoscopy -She underwent a right hemicolectomy by Dr. Debby on April 21, 2022 -I reviewed her surgical pathology findings with her in detail.  She had a stage II disease, without high risk features. -We discussed her risk is low to moderate, adjuvant chemotherapy is not recommended given no high risk features -I recommend checking circulating tumor DNA with GuardantReveal which has been negative  -Repeated CT in 03/2023 and 02/2024 was negative for recurrence

## 2024-03-10 ENCOUNTER — Encounter: Payer: Self-pay | Admitting: Family Medicine

## 2024-03-10 ENCOUNTER — Inpatient Hospital Stay: Attending: Hematology | Admitting: Hematology

## 2024-03-10 VITALS — BP 132/84 | HR 95 | Temp 98.3°F | Resp 16 | Wt 198.7 lb

## 2024-03-10 DIAGNOSIS — C180 Malignant neoplasm of cecum: Secondary | ICD-10-CM | POA: Diagnosis not present

## 2024-03-10 DIAGNOSIS — Z85038 Personal history of other malignant neoplasm of large intestine: Secondary | ICD-10-CM | POA: Insufficient documentation

## 2024-03-10 DIAGNOSIS — N200 Calculus of kidney: Secondary | ICD-10-CM | POA: Insufficient documentation

## 2024-03-10 DIAGNOSIS — C9110 Chronic lymphocytic leukemia of B-cell type not having achieved remission: Secondary | ICD-10-CM | POA: Insufficient documentation

## 2024-03-10 DIAGNOSIS — I700 Atherosclerosis of aorta: Secondary | ICD-10-CM | POA: Insufficient documentation

## 2024-03-10 DIAGNOSIS — E785 Hyperlipidemia, unspecified: Secondary | ICD-10-CM | POA: Insufficient documentation

## 2024-03-10 NOTE — Progress Notes (Signed)
 [facility]   Telephone:(336) [phone] Fax:(336) [phone]   Clinic Follow up Note   Patient Care Team: Waylan Almarie SAUNDERS, MD as PCP - General (Family Medicine) Avram Lupita BRAVO, MD as Consulting Physician (Gastroenterology) Dolphus Reiter, MD as Consulting Physician (Rheumatology) Lanny Callander, MD as Consulting Physician (Oncology) Debby Hila, MD as Consulting Physician (General Surgery)  Date of Service:  03/10/2024  CHIEF COMPLAINT: f/u of colon cancer  CURRENT THERAPY:  Surveillance  Oncology History   Adenocarcinoma of cecum (HCC) pT3N0M0, MSS, G2 -diagnosed in 03/07/2022 through screening colonoscopy -She underwent a right hemicolectomy by Dr. Debby on April 21, 2022 -I reviewed her surgical pathology findings with her in detail.  She had a stage II disease, without high risk features. -We discussed her risk is low to moderate, adjuvant chemotherapy is not recommended given no high risk features -I recommend checking circulating tumor DNA with GuardantReveal which has been negative  -Repeated CT in 03/2023 and 02/2024 was negative for recurrence   Assessment & Plan Colon cancer, status post resection, under surveillance Stage II colon cancer diagnosed in 2023, currently under surveillance. Last colonoscopy in December 2024 showed no recurrence. CT scan today shows no signs of recurrence. - Continue surveillance with colonoscopy in one year - Scheduled follow-up in six months with lab work  Chronic lymphocytic leukemia, stage 0 Chronic lymphocytic leukemia with fluctuating white blood cell count. Current white count is 14.9 with an absolute lymphocyte count of 10, slightly higher than previous but not consistently increasing. No anemia or thrombocytopenia present. No current indication for treatment unless white count doubles within six months or symptoms such as anemia, thrombocytopenia, night sweats, fatigue, or significant weight loss develop. -  Continue monitoring white blood cell count and symptoms - Educated on signs of disease progression requiring treatment  Nephrolithiasis, left kidney, asymptomatic Incidental finding of a 3-4 mm kidney stone in the left kidney on CT scan. Asymptomatic and not causing any problems. - No immediate intervention required  Aortic atherosclerosis, asymptomatic Incidental finding of aortic atherosclerosis on CT scan. Asymptomatic and not causing any problems. - No immediate intervention required  PLAN - Lab and CT scan reviewed, no evidence of cancer recurrence - Will continue cancer surveillance.  Lab and follow-up in 6 months.     SUMMARY OF ONCOLOGIC HISTORY: Oncology History Overview Note   Cancer Staging  Adenocarcinoma of cecum (HCC) Staging form: Colon and Rectum, AJCC 8th Edition - Pathologic stage from 04/21/2022: Stage IIA (pT3, pN0, cM0) - Signed by Lanny Callander, MD on 05/11/2022 Stage prefix: Initial diagnosis Total positive nodes: 0 Histologic grading system: 4 grade system Histologic grade (G): G2 Residual tumor (R): R0 - None     Adenocarcinoma of cecum (HCC)  03/07/2022 Procedure   Colonoscopy by Dr. Avram impression - An infiltrative, polypoid and ulcerated non-obstructing large mass was found in the cecum. The mass was circumferential. This was biopsied with a cold forceps for histology.   03/07/2022 Initial Biopsy   Cecum Biopsy - INVASIVE ADENOCARCINOMA, MODERATELY DIFFERENTIATED (SEE NOTE)   03/07/2022 Tumor Marker   CEA: normal < 2   03/16/2022 Initial Diagnosis   Adenocarcinoma of cecum (HCC)   04/21/2022 Cancer Staging   Staging form: Colon and Rectum, AJCC 8th Edition - Pathologic stage from 04/21/2022: Stage IIA (pT3, pN0, cM0) - Signed by Lanny Callander, MD on 05/11/2022 Stage prefix: Initial diagnosis Total positive nodes: 0 Histologic grading system: 4 grade system Histologic grade (G): G2 Residual tumor (R): R0 -  None   08/10/2022 Genetic Testing    Negative [facility] CancerNext-Expanded.  Report date is 08/10/2022.   The CancerNext-Expanded gene panel offered by [facility] and includes sequencing and rearrangement for the following 77 genes: AIP, ALK, APC, ATM, AXIN2, BAP1, BARD1, BLM, BMPR1A, BRCA1, BRCA2, BRIP1, CDC73, CDH1, CDK4, CDKN1B, CDKN2A, CHEK2, CTNNA1, DICER1, FANCC, FH, FLCN, GALNT12, KIF1B, LZTR1, MAX, MEN1, MET, MLH1, MSH2, MSH3, MSH6, MUTYH, NBN, NF1, NF2, NTHL1, PALB2, PHOX2B, PMS2, POT1, PRKAR1A, PTCH1, PTEN, RAD51C, RAD51D, RB1, RECQL, RET, SDHA, SDHAF2, SDHB, SDHC, SDHD, SMAD4, SMARCA4, SMARCB1, SMARCE1, STK11, SUFU, TMEM127, TP53, TSC1, TSC2, VHL and XRCC2 (sequencing and deletion/duplication); EGFR, EGLN1, HOXB13, KIT, MITF, PDGFRA, POLD1, and POLE (sequencing only); EPCAM and GREM1 (deletion/duplication only).       Discussed the use of AI scribe software for clinical note transcription with the patient, who gave verbal consent to proceed.  History of Present Illness Holly [facility] is a 53 year old female with colon cancer and chronic lymphocytic leukemia who presents for follow-up.  She follows for stage II colon cancer diagnosed in 2023. She feels well with no bowel habit changes, rectal bleeding, or weight loss. Her last colonoscopy was in December 2024.  For chronic lymphocytic leukemia, her white blood cell count has been fluctuating, with a recent value of 14.9 and an absolute lymphocyte count of 10. She has no anemia, thrombocytopenia, night sweats, fatigue, or significant weight loss.  She has sleep apnea treated with CPAP. She uses a low-dose statin for hyperlipidemia with good control.  A recent CT scan showed a small nonobstructing left kidney stone and aortic atherosclerosis. She also mentions a finding of fluid in the fundus.  She has occasional stomach pain when full or needing a bowel movement, which resolves after the bowel movement.     All other systems were reviewed with the patient and are  negative.  MEDICAL HISTORY:  Past Medical History:  Diagnosis Date   Anemia    CLL (chronic lymphocytic leukemia) (HCC)    Colon cancer (HCC) 02/2022   Leukocytosis    Lupus erythematosus    Vitamin D  deficiency     SURGICAL HISTORY: Past Surgical History:  Procedure Laterality Date   CESAREAN SECTION     COLON RESECTION     COLONOSCOPY  03/07/2022    I have reviewed the social history and family history with the patient and they are unchanged from previous note.  ALLERGIES:  has no known allergies.  MEDICATIONS:  Current Outpatient Medications  Medication Sig Dispense Refill   Cholecalciferol  (VITAMIN D -3) 125 MCG (5000 UT) TABS Take 1 tablet by mouth daily.     phentermine 15 MG capsule Take 15 mg by mouth every morning.     No current facility-administered medications for this visit.    PHYSICAL EXAMINATION: ECOG PERFORMANCE STATUS: 0 - Asymptomatic  Vitals:   03/10/24 1355  BP: 132/84  Pulse: 95  Resp: 16  Temp: 98.3 F (36.8 C)  SpO2: 98%   Wt Readings from Last 3 Encounters:  03/10/24 198 lb 11.2 oz (90.1 kg)  10/30/23 194 lb (88 kg)  05/02/23 195 lb 9 oz (88.7 kg)     GENERAL:alert, no distress and comfortable SKIN: skin color, texture, turgor are normal, no rashes or significant lesions EYES: normal, Conjunctiva are pink and non-injected, sclera clear NECK: supple, thyroid  normal size, non-tender, without nodularity LYMPH:  no palpable lymphadenopathy in the cervical, axillary  LUNGS: clear to auscultation and percussion with normal breathing effort HEART:  regular rate & rhythm and no murmurs and no lower extremity edema ABDOMEN:abdomen soft, non-tender and normal bowel sounds Musculoskeletal:no cyanosis of digits and no clubbing  NEURO: alert & oriented x 3 with fluent speech, no focal motor/sensory deficits  Physical Exam   LABORATORY DATA:  I have reviewed the data as listed    Latest Ref Rng & Units 02/28/2024    1:51 PM 10/30/2023     2:36 PM 05/02/2023   11:07 AM  CBC  WBC 4.0 - 10.5 K/uL 14.9  13.8  12.9   Hemoglobin 12.0 - 15.0 g/dL 86.9  87.7  86.8   Hematocrit 36.0 - 46.0 % 38.3  36.9  39.9   Platelets 150 - 400 K/uL 253  237  270         Latest Ref Rng & Units 02/28/2024    1:51 PM 10/30/2023    2:36 PM 05/02/2023   11:07 AM  CMP  Glucose 70 - 99 mg/dL 884  82  98   BUN 6 - 20 mg/dL 12  15  15    Creatinine 0.44 - 1.00 mg/dL 9.11  9.23  9.12   Sodium 135 - 145 mmol/L 139  138  139   Potassium 3.5 - 5.1 mmol/L 4.5  3.9  3.8   Chloride 98 - 111 mmol/L 101  104  105   CO2 22 - 32 mmol/L 27  25  28    Calcium  8.9 - 10.3 mg/dL 9.6  9.3  9.8   Total Protein 6.5 - 8.1 g/dL 7.8  7.4  7.5   Total Bilirubin 0.0 - 1.2 mg/dL 0.3  0.3  0.4   Alkaline Phos 38 - 126 U/L 86  65  66   AST 15 - 41 U/L 26  17  16    ALT 0 - 44 U/L 16  14  15        RADIOGRAPHIC STUDIES: I have personally reviewed the radiological images as listed and agreed with the findings in the report. No results found.    No orders of the defined types were placed in this encounter.  All questions were answered. The patient knows to call the clinic with any problems, questions or concerns. No barriers to learning was detected. The total time spent in the appointment was 25 minutes, including review of chart and various tests results, discussions about plan of care and coordination of care plan     Onita Mattock, MD 03/10/2024

## 2024-03-12 ENCOUNTER — Other Ambulatory Visit: Payer: Self-pay

## 2024-05-15 NOTE — Progress Notes (Unsigned)
 "  Office Visit Note  Patient: Holly Sanders             Date of Birth: [birth year]           MRN: [id number]             PCP: Waylan Almarie SAUNDERS, MD Referring: Waylan Almarie SAUNDERS, MD Visit Date: 05/29/2024 Occupation: Data Unavailable  Subjective:  No chief complaint on file.   History of Present Illness: Holly Sanders is a 54 y.o. female ***     Activities of Daily Living:  Patient reports morning stiffness for *** {minute/hour:19697}.   Patient {ACTIONS;DENIES/REPORTS:21021675::Denies} nocturnal pain.  Difficulty dressing/grooming: {ACTIONS;DENIES/REPORTS:21021675::Denies} Difficulty climbing stairs: {ACTIONS;DENIES/REPORTS:21021675::Denies} Difficulty getting out of chair: {ACTIONS;DENIES/REPORTS:21021675::Denies} Difficulty using hands for taps, buttons, cutlery, and/or writing: {ACTIONS;DENIES/REPORTS:21021675::Denies}  No Rheumatology ROS completed.   PMFS History:  Patient Active Problem List   Diagnosis Date Noted   Genetic testing 08/15/2022   Colon cancer, ascending (HCC) 04/21/2022   Adenocarcinoma of cecum (HCC) 03/16/2022   CLL (chronic lymphocytic leukemia) (HCC) 06/13/2021   Iron  deficiency anemia 05/16/2021   Lymphocytosis 05/16/2021   Primary osteoarthritis of both hands 09/21/2016   Primary osteoarthritis of both feet 09/21/2016   ANA positive 09/21/2016   Medial epicondylitis of elbow, right 09/21/2016    Past Medical History:  Diagnosis Date   Anemia    CLL (chronic lymphocytic leukemia) (HCC)    Colon cancer (HCC) 02/2022   Leukocytosis    Lupus erythematosus    Vitamin D  deficiency     Family History  Problem Relation Age of Onset   Heart disease Mother    Lymphoma Father        dx 35s; recurrence in 33s   Squamous cell carcinoma Father    Heart disease Brother    Breast cancer Paternal Aunt        dx > 50   Colon cancer Paternal Grandmother        or stomach cancer?; dx after 50   Colon polyps Paternal Grandmother    Breast  cancer Paternal Grandmother        dx after 50   Esophageal cancer Paternal Grandfather    Throat cancer Paternal Grandfather        d. > 50   Breast cancer Cousin        paternal female cousin; dx 10s   Rectal cancer Neg Hx    Stomach cancer Neg Hx    Past Surgical History:  Procedure Laterality Date   CESAREAN SECTION     COLON RESECTION     COLONOSCOPY  03/07/2022   Social History[1] Social History   Social History Narrative   Not on file     Immunization History  Administered Date(s) Administered   Influenza-Unspecified 02/08/2021   PFIZER Comirnaty(Gray Top)Covid-19 Tri-Sucrose Vaccine 06/21/2019   PFIZER(Purple Top)SARS-COV-2 Vaccination 07/12/2019   Pfizer Covid-19 Vaccine Bivalent Booster 43yrs & up 02/25/2020   Unspecified SARS-COV-2 Vaccination 06/21/2019, 07/12/2019, 11/08/2019, 06/07/2020, 02/08/2021     Objective: Vital Signs: There were no vitals taken for this visit.   Physical Exam   Musculoskeletal Exam: ***  CDAI Exam: CDAI Score: -- Patient Global: --; Provider Global: -- Swollen: --; Tender: -- Joint Exam 05/29/2024   No joint exam has been documented for this visit   There is currently no information documented on the homunculus. Go to the Rheumatology activity and complete the homunculus joint exam.  Investigation: No additional findings.  Imaging: No results found.  Recent  Labs: Lab Results  Component Value Date   WBC 14.9 (H) 02/28/2024   HGB 13.0 02/28/2024   PLT 253 02/28/2024   NA 139 02/28/2024   K 4.5 02/28/2024   CL 101 02/28/2024   CO2 27 02/28/2024   GLUCOSE 115 (H) 02/28/2024   BUN 12 02/28/2024   CREATININE 0.88 02/28/2024   BILITOT 0.3 02/28/2024   ALKPHOS 86 02/28/2024   AST 26 02/28/2024   ALT 16 02/28/2024   PROT 7.8 02/28/2024   ALBUMIN  4.7 02/28/2024   CALCIUM  9.6 02/28/2024   December 12, 2023 sed rate 11, uric acid 6.1, ANA negative, SSA negative, SSB negative, rheumatoid factor 34, anti-CCP  negative, CRP 3.4 Speciality Comments: No specialty comments available.  Procedures:  No procedures performed Allergies: Patient has no known allergies.   Assessment / Plan:     Visit Diagnoses: ANA positive  Primary osteoarthritis of both hands  Medial epicondylitis of elbow, right  Primary osteoarthritis of both feet  CLL (chronic lymphocytic leukemia) (HCC)  Lymphocytosis  Adenocarcinoma of cecum (HCC)  Other iron  deficiency anemia  Orders: No orders of the defined types were placed in this encounter.  No orders of the defined types were placed in this encounter.   Face-to-face time spent with patient was *** minutes. Greater than 50% of time was spent in counseling and coordination of care.  Follow-Up Instructions: No follow-ups on file.   Maya Nash, MD  Note - This record has been created using Animal nutritionist.  Chart creation errors have been sought, but may not always  have been located. Such creation errors do not reflect on  the standard of medical care.    [1]  Social History Tobacco Use   Smoking status: Never   Smokeless tobacco: Never  Vaping Use   Vaping status: Never Used  Substance Use Topics   Alcohol use: No    Comment: rare, wine   Drug use: No   "

## 2024-05-29 ENCOUNTER — Encounter: Admitting: Rheumatology

## 2024-05-29 DIAGNOSIS — M7701 Medial epicondylitis, right elbow: Secondary | ICD-10-CM

## 2024-05-29 DIAGNOSIS — M19041 Primary osteoarthritis, right hand: Secondary | ICD-10-CM

## 2024-05-29 DIAGNOSIS — C180 Malignant neoplasm of cecum: Secondary | ICD-10-CM

## 2024-05-29 DIAGNOSIS — R682 Dry mouth, unspecified: Secondary | ICD-10-CM

## 2024-05-29 DIAGNOSIS — D508 Other iron deficiency anemias: Secondary | ICD-10-CM

## 2024-05-29 DIAGNOSIS — C9110 Chronic lymphocytic leukemia of B-cell type not having achieved remission: Secondary | ICD-10-CM

## 2024-05-29 DIAGNOSIS — M19071 Primary osteoarthritis, right ankle and foot: Secondary | ICD-10-CM

## 2024-05-29 DIAGNOSIS — D72820 Lymphocytosis (symptomatic): Secondary | ICD-10-CM

## 2024-05-29 DIAGNOSIS — R7689 Other specified abnormal immunological findings in serum: Secondary | ICD-10-CM

## 2024-09-08 ENCOUNTER — Inpatient Hospital Stay: Admitting: Hematology

## 2024-09-08 ENCOUNTER — Inpatient Hospital Stay
